# Patient Record
Sex: Female | Born: 1935 | Race: White | Hispanic: No | Marital: Married | Smoking: Never smoker
Health system: Southern US, Community
[De-identification: ages and names within clinical notes are randomized; demographics above are authoritative.]

## PROBLEM LIST (undated history)

## (undated) DIAGNOSIS — E039 Hypothyroidism, unspecified: Secondary | ICD-10-CM

## (undated) DIAGNOSIS — K5790 Diverticulosis of intestine, part unspecified, without perforation or abscess without bleeding: Secondary | ICD-10-CM

## (undated) DIAGNOSIS — E785 Hyperlipidemia, unspecified: Secondary | ICD-10-CM

## (undated) DIAGNOSIS — I219 Acute myocardial infarction, unspecified: Secondary | ICD-10-CM

## (undated) DIAGNOSIS — G459 Transient cerebral ischemic attack, unspecified: Secondary | ICD-10-CM

## (undated) DIAGNOSIS — I2510 Atherosclerotic heart disease of native coronary artery without angina pectoris: Secondary | ICD-10-CM

## (undated) DIAGNOSIS — D509 Iron deficiency anemia, unspecified: Secondary | ICD-10-CM

## (undated) DIAGNOSIS — I4891 Unspecified atrial fibrillation: Secondary | ICD-10-CM

## (undated) DIAGNOSIS — K648 Other hemorrhoids: Secondary | ICD-10-CM

## (undated) DIAGNOSIS — I10 Essential (primary) hypertension: Secondary | ICD-10-CM

## (undated) DIAGNOSIS — K2270 Barrett's esophagus without dysplasia: Secondary | ICD-10-CM

## (undated) DIAGNOSIS — K219 Gastro-esophageal reflux disease without esophagitis: Secondary | ICD-10-CM

## (undated) DIAGNOSIS — N189 Chronic kidney disease, unspecified: Secondary | ICD-10-CM

## (undated) DIAGNOSIS — K222 Esophageal obstruction: Secondary | ICD-10-CM

## (undated) HISTORY — DX: Gastro-esophageal reflux disease without esophagitis: K21.9

## (undated) HISTORY — DX: Hypothyroidism, unspecified: E03.9

## (undated) HISTORY — DX: Other hemorrhoids: K64.8

## (undated) HISTORY — DX: Hyperlipidemia, unspecified: E78.5

## (undated) HISTORY — DX: Iron deficiency anemia, unspecified: D50.9

## (undated) HISTORY — PX: THYROIDECTOMY, PARTIAL: SHX18

## (undated) HISTORY — DX: Acute myocardial infarction, unspecified: I21.9

## (undated) HISTORY — DX: Chronic kidney disease, unspecified: N18.9

## (undated) HISTORY — DX: Esophageal obstruction: K22.2

## (undated) HISTORY — DX: Transient cerebral ischemic attack, unspecified: G45.9

## (undated) HISTORY — DX: Unspecified atrial fibrillation: I48.91

## (undated) HISTORY — DX: Atherosclerotic heart disease of native coronary artery without angina pectoris: I25.10

## (undated) HISTORY — DX: Diverticulosis of intestine, part unspecified, without perforation or abscess without bleeding: K57.90

## (undated) HISTORY — DX: Essential (primary) hypertension: I10

## (undated) HISTORY — DX: Barrett's esophagus without dysplasia: K22.70

---

## 1988-07-02 HISTORY — PX: PARTIAL HYSTERECTOMY: SHX80

## 1994-07-02 HISTORY — PX: CHOLECYSTECTOMY: SHX55

## 1998-02-16 ENCOUNTER — Ambulatory Visit (HOSPITAL_COMMUNITY): Admission: RE | Admit: 1998-02-16 | Discharge: 1998-02-16 | Payer: Self-pay | Admitting: Obstetrics & Gynecology

## 1998-04-28 ENCOUNTER — Other Ambulatory Visit: Admission: RE | Admit: 1998-04-28 | Discharge: 1998-04-28 | Payer: Self-pay | Admitting: *Deleted

## 1999-12-15 ENCOUNTER — Emergency Department (HOSPITAL_COMMUNITY): Admission: EM | Admit: 1999-12-15 | Discharge: 1999-12-15 | Payer: Self-pay

## 2000-01-04 ENCOUNTER — Ambulatory Visit (HOSPITAL_COMMUNITY): Admission: RE | Admit: 2000-01-04 | Discharge: 2000-01-04 | Payer: Self-pay | Admitting: Cardiology

## 2000-01-04 ENCOUNTER — Encounter: Payer: Self-pay | Admitting: Cardiology

## 2000-03-07 ENCOUNTER — Ambulatory Visit (HOSPITAL_BASED_OUTPATIENT_CLINIC_OR_DEPARTMENT_OTHER): Admission: RE | Admit: 2000-03-07 | Discharge: 2000-03-07 | Payer: Self-pay | Admitting: General Surgery

## 2000-03-07 ENCOUNTER — Encounter (INDEPENDENT_AMBULATORY_CARE_PROVIDER_SITE_OTHER): Payer: Self-pay | Admitting: Specialist

## 2001-01-19 ENCOUNTER — Inpatient Hospital Stay (HOSPITAL_COMMUNITY): Admission: EM | Admit: 2001-01-19 | Discharge: 2001-01-22 | Payer: Self-pay | Admitting: Emergency Medicine

## 2001-01-19 ENCOUNTER — Encounter: Payer: Self-pay | Admitting: Emergency Medicine

## 2001-01-20 ENCOUNTER — Encounter: Payer: Self-pay | Admitting: Internal Medicine

## 2002-07-02 DIAGNOSIS — I2510 Atherosclerotic heart disease of native coronary artery without angina pectoris: Secondary | ICD-10-CM

## 2002-07-02 DIAGNOSIS — I219 Acute myocardial infarction, unspecified: Secondary | ICD-10-CM

## 2002-07-02 HISTORY — DX: Atherosclerotic heart disease of native coronary artery without angina pectoris: I25.10

## 2002-07-02 HISTORY — DX: Acute myocardial infarction, unspecified: I21.9

## 2002-10-28 ENCOUNTER — Encounter: Payer: Self-pay | Admitting: Internal Medicine

## 2002-10-28 ENCOUNTER — Ambulatory Visit (HOSPITAL_COMMUNITY): Admission: RE | Admit: 2002-10-28 | Discharge: 2002-10-28 | Payer: Self-pay | Admitting: Internal Medicine

## 2003-06-15 ENCOUNTER — Encounter: Admission: RE | Admit: 2003-06-15 | Discharge: 2003-06-15 | Payer: Self-pay | Admitting: Nephrology

## 2004-03-07 ENCOUNTER — Inpatient Hospital Stay (HOSPITAL_COMMUNITY): Admission: AD | Admit: 2004-03-07 | Discharge: 2004-03-10 | Payer: Self-pay | Admitting: Internal Medicine

## 2004-11-07 ENCOUNTER — Ambulatory Visit (HOSPITAL_COMMUNITY): Admission: RE | Admit: 2004-11-07 | Discharge: 2004-11-07 | Payer: Self-pay | Admitting: Internal Medicine

## 2006-05-07 ENCOUNTER — Ambulatory Visit: Payer: Self-pay | Admitting: Gastroenterology

## 2006-06-01 DIAGNOSIS — K2270 Barrett's esophagus without dysplasia: Secondary | ICD-10-CM

## 2006-06-01 HISTORY — DX: Barrett's esophagus without dysplasia: K22.70

## 2006-06-12 ENCOUNTER — Encounter (INDEPENDENT_AMBULATORY_CARE_PROVIDER_SITE_OTHER): Payer: Self-pay | Admitting: *Deleted

## 2006-06-12 ENCOUNTER — Ambulatory Visit: Payer: Self-pay | Admitting: Gastroenterology

## 2006-07-29 ENCOUNTER — Ambulatory Visit: Payer: Self-pay | Admitting: Gastroenterology

## 2006-08-13 ENCOUNTER — Ambulatory Visit (HOSPITAL_COMMUNITY): Admission: RE | Admit: 2006-08-13 | Discharge: 2006-08-13 | Payer: Self-pay | Admitting: Internal Medicine

## 2007-01-30 ENCOUNTER — Ambulatory Visit: Payer: Self-pay | Admitting: *Deleted

## 2007-09-22 ENCOUNTER — Ambulatory Visit (HOSPITAL_COMMUNITY): Admission: RE | Admit: 2007-09-22 | Discharge: 2007-09-22 | Payer: Self-pay | Admitting: Internal Medicine

## 2009-05-06 ENCOUNTER — Encounter (INDEPENDENT_AMBULATORY_CARE_PROVIDER_SITE_OTHER): Payer: Self-pay | Admitting: *Deleted

## 2009-12-15 ENCOUNTER — Telehealth: Payer: Self-pay | Admitting: Gastroenterology

## 2009-12-19 ENCOUNTER — Emergency Department: Payer: Self-pay | Admitting: Emergency Medicine

## 2010-02-10 ENCOUNTER — Encounter: Admission: RE | Admit: 2010-02-10 | Discharge: 2010-02-10 | Payer: Self-pay | Admitting: Internal Medicine

## 2010-06-07 ENCOUNTER — Encounter
Admission: RE | Admit: 2010-06-07 | Discharge: 2010-06-07 | Payer: Self-pay | Source: Home / Self Care | Attending: Internal Medicine | Admitting: Internal Medicine

## 2010-08-01 NOTE — Progress Notes (Signed)
Summary: Schedule Colonoscopy  Phone Note Outgoing Call Call back at Home Phone [phone]   Call placed by: Harlow Mares CMA Duncan Dull),  December 15, 2009 1:50 PM Call placed to: Patient Summary of Call: patient is due for EGD for follow up of her Barretts she states that she is sick right now and she does not wish to schedule I advised her that Dr. Russella Dar does not have any open appts until 01/2010 and that we could schedule one in Aug. She still declined stated that she has alot of other stuff going on in her life and she will call back if she changes her mind. I advised her that her Barretts could led to esophageal cancer if not monitered. she verbalized undertanding, and states that she will call back when she decides to have her colonoscopy Initial call taken by: Harlow Mares CMA Duncan Dull),  December 15, 2009 1:54 PM

## 2010-11-14 NOTE — Procedures (Signed)
CAROTID DUPLEX EXAM   INDICATION:  Left eye visual discomfort.   HISTORY:  Diabetes:  No  Cardiac:  Yes  Hypertension:  Yes  Smoking:  No  Previous Surgery:  No.  Please see note below.  CV History:  Amaurosis Fugax Yes No, Paresthesias Yes No, Hemiparesis Yes No                                        RIGHT             LEFT  Brachial systolic pressure:         180               180  Brachial Doppler waveforms:         Biphasic          Biphasic  Vertebral direction of flow:        Antegrade         Antegrade  DUPLEX VELOCITIES (cm/sec)  CCA peak systolic                   93                77  ECA peak systolic                   282               115  ICA peak systolic                   82                76  ICA end diastolic                   16                0.08  PLAQUE MORPHOLOGY:                  Calcified         Calcified  PLAQUE AMOUNT:                      Mild              Mild  PLAQUE LOCATION:                    ICA, ECA          ICA, ECA   IMPRESSION:  1-39% stenosis noted in bilateral ICA's.  Antegrade  bilateral vertebral arteries.   ___________________________________________  Quita Skye Hart [place], M.D.   MG/MEDQ  D:  01/30/2007  T:  01/31/2007  Job:  [id number]

## 2010-11-17 NOTE — H&P (Signed)
NAME:  Karen Stuart, Karen Stuart                      ACCOUNT NO.:  [id number]   MEDICAL RECORD NO.:  [id number]                   PATIENT TYPE:  INP   LOCATION:  2028                                 FACILITY:  [facility]   PHYSICIAN:  Mark A. Perini, M.D.                DATE OF BIRTH:  March 20, 1926   DATE OF ADMISSION:  03/07/2004  DATE OF DISCHARGE:                                HISTORY & PHYSICAL   CHIEF COMPLAINT:  Cough, fever, anorexia, weakness.   HISTORY OF PRESENT ILLNESS:  [name] is a pleasant 77 year old female  with a past medical history significant for hypertension, atherosclerotic  coronary disease, atherosclerotic peripheral vascular disease, chronic renal  insufficiency, hypothyroidism, and hyperlipidemia. She was seen in the  office on February 23, 2004, for a regular visit at which time she was doing  quite well. There were no significant changes made to her regimen at that  time. However, her husband developed a bronchitis respiratory type illness  which he is currently recovering from. However, approximately four or five  days prior to admission the patient developed hoarseness and cough, and over  the last three to four days has had fevers up to 101 as well as significant  nonproductive cough. She has had very poor appetite with very little oral  intake and she has felt quite fatigued. She presented to the office today at  which time she had a peak flow of 200 liters per minute, pulse oximetry 93%  on room air, blood pressure 128/68, pulse mildly tachycardic at 110,  respiratory rate 24, temperature 98.2. Her weight was down 5 pounds since  her previous visit just two weeks ago. Upon exam she had decreased breath  sounds throughout the right side of her lung field and chest x-ray was  suspicious for a right-sided lower lobe infiltrate. It was contemplated  treating her as an outpatient; however the patient did appear quite weak and  therefore arrangements were made to  have her directly admitted to the  hospital. However when she stood up to try to walk to the car to come to  admitting, she became weak, and sat down in a chair in our hallway. At that  point she then had a syncopal episode where she had complete loss of  consciousness. There was no injury sustained and she was brought to the  floor and lay down supine. For the 10 to 15 seconds she was completely  unresponsive, with loss of color and no pulse felt for a few seconds.  However, she then spontaneously regained pulse, opened her eyes, and began  breathing normally. At that time blood pressure was 170/70, pulse 110,  saturation 97% on room air. She was completely appropriate and oriented at  that time. There was no definite seizure activity. No stroke activity noted.  The patient had no chest pain or shortness of breath. It was felt that this  event was  most likely due to orthostasis from her poor oral intake and her  ongoing pneumonia. An ambulance was summoned emergently and the patient was  transported to the emergency room where further care was given. She will  require admission for further treatment.   PAST MEDICAL HISTORY:  1.  Hypertension.  2.  History of migraines.  3.  Hypothyroidism.  4.  Post menopausal status.  5.  Hypolipidemia.  6.  Remote history of  peptic ulcer disease.  7.  Severe bruising, on aspirin and Plavix in 2002, but she has since that      time done well with Plavix monotherapy.  8.  TIA in July 2002.  9.  G2, P2 parity status with normal vaginal deliveries.  10. Atherosclerotic coronary disease, status post MI while at [facility]      in July 2004. At that time no interventions were done, but she did have      a four day admission and was treated medically. Ejection fraction was      45%.  11. She has chronic renal insufficiency since at least 2003 and her baseline      BUN and creatinine from our office are 29 and 2.1 from just two weeks      ago.  12.  History of cholecystectomy and appendectomy as well as thyroidectomy and      partial hysterectomy, bladder tack, and bilateral salpingo-oophorectomy.  13. In September 2001, she was diagnosed with probable temporal arteritis      and did well with a course of prednisone with apparent remission of      this.  14. Osteoporosis since May 2003.   ALLERGIES:  AMOXICILLIN; CODEINE; severe bruising with ASPIRIN AND PLAVIX  combination therapy; LESCOL XL and ZOCOR cause severe myalgias and weakness;  WELCHOL, the patient had difficulty swallowing pills; COLESTYRAMINE caused  gas and she could not take it along with EVISTA therapy.   CURRENT MEDICATIONS:  1.  Norvasc 10 mg daily.  2.  Plavix 75 mg daily.  3.  Evista 60 mg daily.  4.  Triamterene/hydrochlorothiazide 37.5/25 mg one tablet daily.  5.  Lopressor 50 mg b.i.d.  6.  Nitroglycerin tablet 0.4 mg as needed.  7.  Vytorin 10/80 one tablet each evening.  8.  Benazepril 40 mg daily.  9.  Calcium with vitamin D daily.   SOCIAL HISTORY:  She is married. Her husband, Sharon Seller, is with her. This is  her second marriage. She has been married for five years. She has one living  daughter. No grandchildren. She has a high school education. 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 No tobacco history. No alcohol history.  No drug use history.   FAMILY HISTORY:  Father died at age 61 of an MI. Mother died at age 21 of an  MI. She has one brother who has had five bypasses. One son died at age 59 of  an MI and one daughter is still living and in good health.   REVIEW OF SYSTEMS:  As per the history of present illness. The patient  denies any blood from above or below. She has not producing sputum, but  feels as though there is a lot of congestion in her chest that has not come  up yet. She has had chills on and off. She denies any rhinitis or otitis  type symptoms. She has no history of asthma. She did have loose stools two times. She  denies any  myalgias or arthralgias. No new chest pains or edema.  No nausea, vomiting, or constipation noted.   PHYSICAL EXAMINATION:  VITAL SIGNS: Current temperature is 98.4, pulse 109  and regular, respiratory rate 20, blood pressure 146/53, 97% saturation on  room air.  GENERAL: She is in no acute distress. She is hoarse.  LUNGS: She has decreased breath sounds bilaterally with some slight wheezes  on expiration noted.  HEART: Regular rate and rhythm, mildly tachycardic with a 2/6 murmur at the  right and left sternal border and systole.  ABDOMEN: Soft and nontender. There is no peripheral edema.  NEUROLOGIC: Intact; alert and oriented times three with movement of  extremities time four.   LABORATORY DATA:  BNP is 105, troponin-I 0.04, CK 96, MB 1.6.  LFTs are  normal except for an albumin of 3.0. Sodium is 131, potassium 3.6, chloride  99, CO2 20, BUN 26, creatinine 2.2, glucose 127. White count 12.9 with 92%  segs, 4% lymphocytes, hemoglobin 10.3, platelet count 266,000. Blood  cultures times two are pending. A urine with legionella antigen has been  sent from our office. She did have one dose of an oral antibiotic, Levaquin  750 mg, once in the office prior to the blood cultures being sent. EKG in  the hospital showed sinus tachycardia with left ventricular hypertrophy and  left axis deviation, but otherwise is essentially normal.   Chest x-ray in the office suspicious of right lower lobe infiltrate. Chest x-  ray in the hospital is pending.   ASSESSMENT AND PLAN:  A 77 year old female with underlying coronary disease,  peripheral vascular disease, chronic renal insufficiency, and  hypothyroidism, who presents with a right-sided community-acquired pneumonia  with some bronchospasm as well as significant orthostasis and an episode of  syncope in the office today. We will admit to a telemetry unit. We will  hydrate with normal saline and treat with intravenous Avelox. We will  treat  with Xopenex nebulizer as well as Advair b.i.d. We will DVT prophylax his  Lovenox and place the patient on Protonix for ulcer prophylaxis. Will  continue her home medications to the best of our ability. However, we will  place her Evista on hold as she will be immobilized. Hopefully with the  treatment of her underlying pneumonia her strength will improve and she will  no longer be orthostatic.                                                Mark A. Waynard Edwards, M.D.    MAP/MEDQ  D:  03/07/2004  T:  03/07/2004  Job:  [id number]   cc:   Darden Palmer., M.D.  [address], [address]  [place]  [place] [address]  Fax: [phone]   Cecille Aver, M.D.  [address]  [place]  [place] [address]  Fax: [phone]

## 2010-11-17 NOTE — Discharge Summary (Signed)
NAME:  Karen Stuart, Karen Stuart                      ACCOUNT NO.:  [id number]   MEDICAL RECORD NO.:  [id number]                   PATIENT TYPE:  INP   LOCATION:  2028                                 FACILITY:  [facility]   PHYSICIAN:  Mark A. Perini, M.D.                DATE OF BIRTH:  [birth year]   DATE OF ADMISSION:  03/07/2004  DATE OF DISCHARGE:  03/10/2004                                 DISCHARGE SUMMARY   DISCHARGE DIAGNOSES:  1.  Right-sided community acquired pneumonia, particularly involving the      right upper lobe and to some degree the right lower lobe, improving at      the time of discharge.  2.  Syncope due to dehydration and orthostasis prior to admission with no      recurrence.  3.  Hypokalemia replaced during this admission.  4.  Hypertension.  5.  Hypothyroidism, stable.  6.  Chronic renal insufficiency, stable.  7.  Hyperlipidemia.  8.  Atherosclerotic coronary disease, stable at this time.  9.  History of migraines.  10. History of transient ischemic attack in 2002.  11. Remote history of temporal arteritis with no evidence of this currently.  12. Osteoporosis.   PROCEDURE:  None.   DISCHARGE MEDICATIONS:  1.  Mucinex over-the-counter 600 mg two tablets b.i.d. as an expectorant.  2.  Norvasc 10 mg daily.  3.  Plavix 75 mg daily.  4.  Lotensin 40 mg daily.  5.  Triamterene/hydrochlorothiazide 37.5/25 one daily.  6.  Metoprolol 50 mg b.i.d.  7.  Evista 60 mg daily once patient is more mobile.  8.  Advair 100/50 one puff b.i.d. until further notice.  9.  Vytorin 10/80 one tablet daily.  10. Calcium with D as before.  11. Nitroglycerin as needed as before.  12. Tessalon Perles 100 mg q.8h. as needed for cough.  13. Avelox 400 mg once daily for six further days.  14. Tylenol as needed.  15. Xopenex and Atrovent nebulizers t.i.d. until further notice.   HISTORY OF PRESENT ILLNESS:  [name] is a 77 year old female with past  history of hypertension, coronary  disease, and chronic renal insufficiency.  She presented to the office on the day of admission with a four to five day  history of fevers and cough.  She also had poor oral intake and was quite  fatigued.  In the office vital signs were stable with the exception of a  mild tachycardia of 110 beats per minute.  She was orthostatic in the  office.  It was elected to admit her for further care.  Prior to her coming  in she had a syncopal event in the hallway of our office.  She quickly  responded to Trendelenburg position and IV fluid resuscitation.  EKG showed  LVH, but no acute changes.  She was admitted for further care.   HOSPITAL COURSE:  [name] was admitted.  Follow-up  x-ray confirmed the  right upper lobe pneumonia.  She was treated with oxygen, nebulizer  treatments, and intravenous antibiotics as well as gentle IV hydration.  She  gradually improved.  She defervesced.  She had no further syncopal episodes  and no problems were noted on her telemetry.  By March 10, 2004 she was  deemed stable for discharge home for continued outpatient care.   PHYSICAL EXAMINATION:  VITAL SIGNS:  Afebrile.  Temperature 98, pulse 100,  respiratory rate 18, blood pressure 153/64, 98% saturation on room air.  She  had had 1600 mL in, 500 mL out.  GENERAL:  She is in no acute distress.  LUNGS:  Relatively clear to auscultation bilaterally with no significant  wheezes, rales, or rhonchi.  HEART:  Regular rate and rhythm with a 1-2/6 murmur at the left sternal  border that was unchanged.  ABDOMEN:  Soft and nontender.  EXTREMITIES:  There was no peripheral edema.  NEUROLOGIC:  She was alert and oriented x4.   DISCHARGE LABORATORIES:  White count 10.1 with 78% segs, 11% lymphocytes, 8%  monocytes, hemoglobin 10.1, platelet count 329,000.  Sodium 135, potassium  4.1, chloride 105, CO2 22, BUN 17, creatinine 1.9, glucose 106, calcium 8.9.  TSH was 0.38.  Blood cultures x2 were negative at the  time of discharge.  Urine culture was negative at the time of discharge but it should be noted  that these were both taken with one dose of antibiotic on-board.   DISCHARGE INSTRUCTIONS:  [name] is to be up as tolerated.  She is to  follow her previous diet which is low fat, low cholesterol, low salt diet,  although she may liberalize her diet for the next week or two as she tries  to improve her strength.  She is to call if she has any recurrent problems.  She will follow up in our office in approximately one week.                                                Mark A. Waynard Edwards, M.D.    MAP/MEDQ  D:  03/10/2004  T:  03/11/2004  Job:  [id number]   cc:   Darden Palmer., M.D.  [address]  [place]  [place] [address]  Fax: [phone]   Cecille Aver, M.D.  [address]  [place]  [place] [address]  Fax: [phone]

## 2010-11-17 NOTE — H&P (Signed)
[facility]  Patient:    Karen Stuart, Karen Stuart                     MRN: [id number] Adm. Date:  [id number] Attending:  Beatris Ship                         History and Physical  CHIEF COMPLAINT:  Garbled speech.  HISTORY OF PRESENT ILLNESS:  [name] is a pleasant 77 year old white female with a lengthy history of essential hypertension, presenting at this time with garbled speech.  She does have a history of intermittent/chronic headaches and a long-standing migraine history.  Evidently, she has been worked up recently for temporal arteritis with temporal artery biopsy negative.  She is currently on a prednisone taper and by her report, is being referred to a pain clinic, but the details are not clear.  She has had a CT/MRI of the brain one year prior which was negative.  She has been in very good health but has had a typical migraine-type headache for approximately three days.  This has not interrupted her sleep and it has been unaccompanied by nausea or vomiting.  She relates going to church this morning and subsequently driving to [facility] to assist daughter having been locked out of a car.  She returned home and took a nap.  Upon arising in response to the telephone, she related that she could not speak.  Evidently, she understood everything that was going on and clearly understood that she could not express herself.  She went to let her husband know that daughter was on the phone and daughter quickly alerted husband that mother could not speak. Husband clearly relates that speech was nonsensible and was in broken sentences with some mispronunciation but some of the words were certainly intelligible.  After about 30 minutes, she totally cleared up and during which she had absolutely no chest pain or shortness of breath, no motor or sensory symptoms and no altered mental status.  She presented to the emergency room without symptomatology.   She has had no dizziness or light-headedness and denies any head trauma.  ALLERGIES:  No known drug allergies.  MEDICATIONS: 1. Toprol-XL 100 mg p.o. q.d. 2. Accupril 40 mg p.o. q.d. 3. Prednisone 5 mg one-half daily.  MEDICAL ILLNESSES:  Essential hypertension and migraine headaches.  SURGICAL ILLNESSES:  Cholecystectomy, appendectomy, total abdominal hysterectomy, temporal artery biopsy.  FAMILY HISTORY:  Noncontributory.  SOCIAL HISTORY:  Patient is married and has one child.  Does have one son deceased with premature cardiac disease.  She does not smoke or drink.  PHYSICAL EXAMINATION:  VITAL SIGNS:  Temperature 97.9, blood pressure 220/120, pulse 80, respiratory rate 20.  GENERAL:  Patient is pleasant, a bit anxious, in no distress, lying supine in bed.  SKIN:  Warm and non-diaphoretic.  HEENT:  Anicteric.  Pupils are round and reactive of approximately 3 to 4 mm. Extraocular movements intact.  Visual fields intact.  Fundi not visualized.  NECK:  Supple.  No JVD or bruits.  Carotids 2+/4+.  No bruits.  LUNGS:  Clear.  CARDIOVASCULAR:  Regular rate and rhythm.  No murmur, gallop, rub or heave.  ABDOMEN:  Soft, nontender.  No hepatosplenomegaly.  Bowel sounds normal.  EXTREMITIES:  No cyanosis, clubbing or edema.  Distal pulses intact.  Joints normal.  Calves soft and nontender.  NEUROLOGIC:  Higher cortical functioning is intact.  Cranial nerves II-XII intact.  Motor  5/5.  Sensation intact to all modalities.  Gait not assessed. Cerebellar exam is normal.  IMAGING STUDY:  CT scan of the brain unremarkable.  LABORATORY DATA:  Additional lab work is pending at this time.  ASSESSMENT: 1. Probable left brain transient ischemic attack with expressive aphasia,    currently resolved. 2. Accelerated hypertension superimposed upon essential hypertension, stable.  PLAN:  Patient is admitted for hypertensive control, neurologic workup and initiation of empiric  aspirin and Plavix.  Carotid Dopplers will be done and MRI done at some point as well, further pending her course. DD:  01/19/01 TD:  01/20/01 Job: [id number]

## 2010-11-17 NOTE — Assessment & Plan Note (Signed)
[facility]                         GASTROENTEROLOGY OFFICE NOTE   Karen, Stuart                   MRN:          [id number]  DATE:07/29/2006                            DOB:          [birth year]    [name] returns following colonoscopy and upper endoscopy. A short  segment of Barrett's esophagus was noted without evidence of dysplasia.  [name] has ongoing complaints of flatulence and intestinal gas  associated with alternating diarrhea and constipation. She feels her  flatulence and intestinal gas has worsened since the procedures. This  also correlates with the time of changing from Protonix to Omeprazole  for cost reasons.   CURRENT MEDICATIONS:  Listed on the chart, updated and reviewed.   MEDICATION ALLERGIES:  CODEINE.   PHYSICAL EXAMINATION:  In no acute distress. Weight 140.8 pounds, blood  pressure 170/70, pulse 60 and regular.  CHEST: Clear to auscultation bilaterally.  CARDIAC: Regular rate and rhythm without murmurs appreciated.  ABDOMEN: Soft and nontender. Nondistended. Normoactive bowel sounds. No  palpable organomegaly, masses or hernias.   ASSESSMENT/PLAN:  1. Gastroesophageal reflux disease complicated by a peptic stricture      and Barrett's esophagus. Her intestinal gas has increased since      starting Omeprazole, therefore, will discontinue Omeprazole and      resume Protonix 40 mg p.o. q a.m. She is to maintain all standard      anti-reflux measures. Recall endoscopy planned for December 2010.  2. Intestinal gas/flatus, alternating diarrhea and constipation. She      is given low gas diet and advised to use Benefiber supplement on a      daily basis and increase her fluid intake. Return office visit in 4-      6 weeks. If her symptoms do not substantially improve, will      consider empiric trial of Xifaxan.     Karen Stuart. Karen Dar, MD, [facility]  Electronically Signed    MTS/MedQ  DD: 07/29/2006  DT:  07/29/2006  Job #: [id number]   cc:   Karen Stuart, M.D.

## 2010-11-17 NOTE — Op Note (Signed)
[place]  Patient:    Karen Stuart, Karen Stuart                     MRN: [id number] Proc. Date: 03/07/00 Adm. Date:  [id number] Attending:  Brandy Hale CC:         Rodrigo Ran, M.D.   Operative Report  PREOPERATIVE DIAGNOSIS:  Rule out giant cell arteritis.  POSTOPERATIVE DIAGNOSIS:  Rule out giant cell arteritis.  OPERATION PERFORMED:  Right temporal artery biopsy.  SURGEON:  Angelia Mould. Derrell Lolling, M.D.  OPERATIVE INDICATIONS:  This is a 77 year old woman who has right-sided headaches with intermittent visual impairment associated with her headaches. She has hypertension.  Workup has revealed elevated sedimentation rate.  She has been placed steroids and her symptoms have improved.  She was referred for a temporal artery biopsy to rule out giant cell arteritis.  DESCRIPTION OF PROCEDURE:  The patient was brought to the minor surgery room at [facility] where the right pretemporal area was prepped and draped in a sterile fashion.  One percent Xylocaine with epinephrine was used as a local infiltration anesthetic.  An incision was made in front of the right ear and extended anteriorly along the course of the main temporal artery and the anterior branch.  Dissection was carried down through the subcutaneous tissue.  We dissected into the fascia and identified the temporal artery.  We isolated about a 2.5 cm segment of the right temporal artery.  The artery was clamped proximally and distally and excised.  This was sent for histologic exam with appropriate clinical history attached.  The ends of the artery were ligated with 3-0 Vicryl ties.  The wound was irrigated with saline. Hemostasis was excellent.  The skin was closed with a running subcuticular suture of 4-0 Vicryl and Steri-Strips.  Clean bandages were placed and the patient taken to the recovery room in stable condition.  Estimated blood loss was about 5 cc.  Complications  none. Sponge, needle, and instrument counts were correct. DD:  03/07/00 TD:  03/08/00 Job: [id number]

## 2010-11-17 NOTE — Assessment & Plan Note (Signed)
[facility]                           GASTROENTEROLOGY OFFICE NOTE   Karen, Stuart                   MRN:          [id number]  DATE:05/07/2006                            DOB:          [birth year]    REFERRING PHYSICIAN:  Loraine Leriche A. Perini, M.D.   REASON FOR REFERRAL:  Recent epigastric pain and chronic diarrhea.   HISTORY OF PRESENT ILLNESS:  [name] is a 77 year old white female  referred through the courtesy of Dr. Waynard Edwards.  She notes problems with  diarrhea occurring approximately two to three times a week for many years.  She states her symptoms began just after her cholecystectomy in 1966.  Her  diarrhea has worsened over the past several weeks and she has had episodes  of fecal incontinence.  She notes no melena, hematochezia, change in stool  caliber, nausea, vomiting or weight loss.  She also relates the recent onset  of epigastric pain which has essentially resolved since beginning Protonix.  No dysphagia or odynophagia. A urinary tract infection was recently  diagnosed and she was treated with a short course of Cipro.  Blood work from  April 15, 2006 revealed a mildly elevated BUN and creatinine which is a  chronic stable problem.  The remainder of her chemistry panel and CBC were  unremarkable.  Amylase and lipase were normal.  A CT scan of the abdomen and  pelvis with oral contrast only was performed on [facility] on April 16, 2006 which showed prior cholecystectomy, common bowel duct measuring at 1 cm  at the porta hepatis, a small right adrenal nodule, multiple parapelvic  renal cysts, atherosclerosis, sigmoid colon diverticulosis and some urinary  bladder air.   PAST MEDICAL HISTORY:  1. Hypertension.  2. Coronary artery disease, status post myocardial infarction in 2004.  3. Hyperlipidemia.  4. History of TIAs.  5. History of an unspecified thyroid disorder.  6. Chronic renal insufficiency.  7. Status post  cholecystectomy in 1996.  8. Status post partial hysterectomy with a bladder tacking 1990.  9. Sigmoid colon diverticulosis.   CURRENT MEDICATIONS:  Listed on the chart, updated and reviewed.   MEDICATION ALLERGIES:  CODEINE.   SOCIAL HISTORY/REVIEW OF SYSTEMS:  Per the handwritten form.   PHYSICAL EXAMINATION:  A well-developed, well-nourished white female in no  acute distress.  Height 5 feet 2 inches.  Weight 137 pounds.  Blood pressure  is 178/68, pulse 60 and regular.  HEENT:  Anicteric sclerae.  Oropharynx clear.  CHEST:  Clear to auscultation bilaterally.  CARDIAC:  Regular rate and rhythm without murmurs appreciated.  ABDOMEN:  Soft, nontender, and nondistended.  Normal active bowel sounds.  No palpable organomegaly, masses or hernias.  RECTAL:  No lesions.  Hemoccult negative stool and seemingly normal  sphincter tone.  EXTREMITIES:  Without clubbing, cyanosis, or edema.  NEUROLOGICAL:  Alert and oriented x3.  Grossly nonfocal.   ASSESSMENT/PLAN:  1. New onset epigastric pain:  Symptoms resolved on Protonix.  Rule out      gastritis, gastroesophageal reflux disease and ulcer disease.  Possible      biliary etiology given  mild biliary dilation. The biliary dilation is      probably related to her prior cholecystectomy and therefore may not      indicate any biliary pathology. Monitor LFTs in 2-3 months. Risks,      benefits, and alternatives to upper endoscopy discussed with the      patient and she consents to proceed.  This will be scheduled      electively.  She will remain on Protonix 40 mg p.o. q.a.m. If her      symptoms recur and the EGD is not diagnostic would repeat LFTs and      consider MRCP or ERCP.  2. Chronic diarrhea with a recent exacerbation and episodic fecal      incontinence:  Obtain standard stool studies for culture and      sensitivity, Clostridium difficile assay and ova and parasite studies.      After these studies are obtained, she will begin an  empiric trial of      metronidazole unless otherwise indicated from the stool studies.  May      use Imodium b.i.d. p.r.n.  She is advised to increase her fiber intake.      Rule out colorectal neoplasm and inflammatory bowel disease.  Risks,      benefits, and alternatives to colonoscopy and possible biopsy and      possible polypectomy discussed with the patient.  She consents to      proceed.  This will be scheduled electively at the time of her upper      endoscopy.     Venita Lick. Russella Dar, MD, [facility]  Electronically Signed    MTS/MedQ  DD: 05/12/2006  DT: 05/12/2006  Job #: [id number]   cc:   Loraine Leriche A. Perini, M.D.

## 2010-11-17 NOTE — Discharge Summary (Signed)
[facility]  Patient:    Karen Stuart, Karen Stuart                     MRN: [id number] Adm. Date:  [id number] Disc. Date: 01/22/01 Attending:  Beatris Ship CC:         Genene Churn. Love, M.D.  Aundra Dubin, M.D.   Discharge Summary  DISCHARGE DIAGNOSES: 1. Left brain transient ischemic attack with aphasia, symptoms resolved. 2. Small vessel disease bilaterally by MRI. 3. Essential tremor. 4. Severe hypertension. 5. Headache disorder thought possibly to be a temporal arteritis, although    other headache considerations such as migraines versus uncontrolled    hypertension remain in the differential for this patient.  PROCEDURES: 1. CT scan of the head. 2. MRI/MRA of the brain. 3. Carotid Dopplers.  DISCHARGE MEDICATIONS: 1. Plavix 75 mg p.o. q.d. 2. Enteric-coated aspirin 325 mg q.d. 3. Prednisone 2.5 mg q.d. 4. Toprol XL 100 mg q.d. 5. Accupril 40 mg q.d. 6. Norvasc 10 mg q.d. 7. Protonix 40 mg q.d. with a meal. 8. Maxzide 37.5/25 1/2 tablet q.d.  HISTORY OF PRESENT ILLNESS:  [name] is a 77 year old female with a history of hypertension and possible history of temporal arteritis versus other headache disorder.  The patient had been in her usual state of health, although she had still been having some migraine headaches especially for the last three days.  She is due to be evaluated in the headache clinic in the near future.  Upon arising from a nap on the day of admission, she had clear understanding of all that was being spoken to her, however, she could not express herself in any way, she could not speak at all.  Speech attempts were nonsensible and broken with mispronunciation and occasional intelligible words.  After approximately 30 minutes, her speech cleared.  She denied any other constitutional symptoms.  She presented to the emergency room asymptomatic and was admitted for further evaluation and treatment.  HOSPITAL  COURSE:  [name] was noted on admission to have severe hypertension with blood pressures 220/120 and higher.  She was placed on aspirin and Plavix, and antihypertensive medication was started, including a labetalol drip, although she required very little of this.  She had no recurrence of her aphasia symptoms during the admission.  Neurology was consulted and thought that she likely had a transient ischemic attack.  CT scan of the head initially did not show any acute infarct.  Carotid Dopplers performed showed no internal carotid artery stenosis bilaterally, and antegrade vertebral artery flow.  Subsequently, the patient had an MRI/MRA which showed significant small vessel disease bilaterally.  Furthermore, on the MRA images, she had evidence of a stenotic MCA vessel on the left, possible stenosis of a PCA vessel on the left.  Ms. Thompsons blood pressure did respond, although still remained somewhat labile at the time of discharge, ranging from the 140s systolic still up to the 240s at times.  She maintained full neurologic status, and had no further symptoms at the time of discharge. A 2-D echocardiogram was performed on 7/23, and the final report is being awaited and we will be informed of this before discharge.  The patient will be discharged home on 01/22/01, in stable condition.  NOTABLE LABORATORY DATA:  TSH on 7/21 was 0.95.  T4 was 5.7.  Sedimentation rate was 38.  Urinalysis was normal.  Comprehensive metabolic panel was normal with the exception of a glucose of 133.  INR  was 0.9 with a PTT of 33 seconds. CBC showed a white count of 8.6, hemoglobin 13.4, and platelet count of 252,000 with a normal differential.  DISCHARGE INSTRUCTIONS: 1. [name] is to take her discharge medications as instructed. 2. She is to follow a low salt diet, less than 2 g daily. 3. She is to call if she has any further problems or symptoms.  With any    significant symptoms, she is advised to  return immediately to the emergency    room. 4. She is to monitor her blood pressure daily with a goal blood pressure    ultimately less than 130/80, but she is to notify us if she is remaining    higher than the 190s/100s for any extended length of time. 5. She is to return for followup with Dr. ______ in two weeks, and she is to    be fasting at that time for a fasting lipid profile. DD:  01/22/01 TD:  01/22/01 Job: [id number]

## 2010-11-17 NOTE — Consult Note (Signed)
[facility]  Patient:    Karen Stuart, Karen Stuart                     MRN: [id number] Proc. Date: 01/19/01 Adm. Date:  [id number] Attending:  Beatris Ship                          Consultation Report  REASON FOR CONSULTATION:  Karen Stuart is a 77 year old right-handed white married female seen at the request of Dr. Gerlene Burdock A. Jacky Kindle and Dr. Earlyne Iba for evaluation of language disturbance.  HISTORY OF PRESENT ILLNESS:  Karen Stuart has a known prior history of migraine headaches beginning approximately 50 years ago occurring at a frequency of 2 or 3 times per year.  These headaches would occur in the right or left temporal region.  She would have visual disturbance with her headaches.  More commonly, they would be on the right than the left.  She states that in June of 2002, she began having headaches that would occur on a daily basis.  These were evaluated with sed rate, result unknown, and with right temporal biopsy which was unremarkable.  She was on a course of steroids and has been seen by a rheumatologist and slowly tapered to half of a 5 mg tablet per day.  Over the last two days, she has had recurrent headache and today, was talking on the telephone with her daughter.  She had difficulty getting her words out and could not speak; this occurred approximately 5:30 p.m.  She improved but came to the emergency room.  She has not had any history of jaw claudication, visual loss or muscle aches and pains.  PAST MEDICAL HISTORY:  Her past medical history is significant for hypertension for 10 years.  She has not had any history of myocardial infarction, diabetes mellitus, stroke or cigarette use.  MEDICATIONS: 1. Toprol-XL 100 mg one q.d. 2. Accupril 40 mg one q.d. 3. Prednisone 4 mg a half q.d.  No aspirin.  PHYSICAL EXAMINATION:  GENERAL:  Physical examination revealed a well-developed, pleasant white female.  VITAL  SIGNS:  Blood pressure high at 220/80 in the right arm and 220/90 in the left.  NEUROLOGIC:  Disks were flat.  The extraocular movements were full and corneals were present.  The facial sensation was equal.  There was no facial motor asymmetry.  Hearing was intact with air conduction greater than bone conduction.  Tongue was midline, uvula was midline and gags were present. Sternocleidomastoid and trapezius testing were normal.  Motor examination revealed good strength in the upper and lower extremities without any evidence of drift and coordination testing revealed outstretched hand and arm tremor. Sensory examination intact to pinprick and deep tendon reflexes 2+ with downgoing plantar responses.  She would follow one-, two- and three-step commands but used occasional neologisms, had some difficulty getting her words out and had definite difficulty in repetition.  IMPRESSION: 1. Aphasia, code 784.3. 2. Headaches, code 784.40. 3. Hypertension, code 796.2. 4. Tremor, code 333.1.  PLAN:  Patient is admitted for further studies to include MRI, MRA and Doppler study of the carotids.  Studies to date have included a urinalysis, a 12-lead EKG and CT scan of the brain without contrast, which to my eye appeared to be normal. DD:  01/19/01 TD:  01/20/01 Job: [id number]

## 2011-05-14 ENCOUNTER — Other Ambulatory Visit: Payer: Self-pay | Admitting: Internal Medicine

## 2011-05-14 DIAGNOSIS — R911 Solitary pulmonary nodule: Secondary | ICD-10-CM

## 2011-05-21 ENCOUNTER — Encounter: Payer: Self-pay | Admitting: Gastroenterology

## 2011-05-23 ENCOUNTER — Ambulatory Visit
Admission: RE | Admit: 2011-05-23 | Discharge: 2011-05-23 | Disposition: A | Discharge status: Home / Self Care | Payer: Medicare Other | Source: Ambulatory Visit | Attending: Internal Medicine | Admitting: Internal Medicine

## 2011-05-23 DIAGNOSIS — R911 Solitary pulmonary nodule: Secondary | ICD-10-CM

## 2011-06-12 ENCOUNTER — Encounter: Payer: Self-pay | Admitting: Gastroenterology

## 2011-06-12 ENCOUNTER — Other Ambulatory Visit: Payer: Medicare Other

## 2011-06-12 ENCOUNTER — Telehealth: Payer: Self-pay | Admitting: Gastroenterology

## 2011-06-12 ENCOUNTER — Ambulatory Visit (INDEPENDENT_AMBULATORY_CARE_PROVIDER_SITE_OTHER): Payer: Medicare Other | Admitting: Gastroenterology

## 2011-06-12 VITALS — BP 142/68 | HR 60 | Ht 63.0 in | Wt 124.0 lb

## 2011-06-12 DIAGNOSIS — K2270 Barrett's esophagus without dysplasia: Secondary | ICD-10-CM

## 2011-06-12 DIAGNOSIS — Z7901 Long term (current) use of anticoagulants: Secondary | ICD-10-CM

## 2011-06-12 DIAGNOSIS — D509 Iron deficiency anemia, unspecified: Secondary | ICD-10-CM

## 2011-06-12 NOTE — Patient Instructions (Signed)
Go directly to the basement today to have your labs drawn. Please call back to schedule your Endoscopy/ Colonoscopy with regular sedation off your coumadin.  You will be contaced by our office prior to your procedure for directions on holding your Coumadin/Warfarin.  If you do not hear from our office 1 week prior to your scheduled procedure, please call [phone] to discuss. Please restart your Iron one tablet by mouth twice daily.  cc: Rodrigo Ran, MD

## 2011-06-12 NOTE — Progress Notes (Signed)
History of Present Illness: This is an 77 year old female with iron deficiency anemia and Hemoccult negative. She underwent colonoscopy and upper endoscopy in December 2007 with findings of Barrett's esophagus, esophageal stricture, gastritis and colonic diverticulosis. She was recommended to undergo surveillance endoscopy for Barrett's in 2010 but did not return. She's not been taking any medications for acid reflux and denies any reflux symptoms. She was recently found to have an iron deficiency anemia with a ferritin of 12.1, iron saturation of 4% and hemoglobin of 9.4. She was recommended to start on iron but has not been taking iron. Her husband recently passed away. Denies weight loss, abdominal pain, constipation, diarrhea, change in stool caliber, melena, hematochezia, nausea, vomiting, dysphagia, reflux symptoms, chest pain.  No Known Allergies Outpatient Prescriptions Prior to Visit  Medication Sig Dispense Refill  . amLODipine (NORVASC) 10 MG tablet Take 10 mg by mouth daily.        . benazepril (LOTENSIN) 40 MG tablet Take 40 mg by mouth daily.        . Calcium Carbonate-Vitamin D (CALTRATE 600+D PO) Take 1 tablet by mouth 2 (two) times daily.        Marland Kitchen doxazosin (CARDURA) 2 MG tablet Take 2 mg by mouth at bedtime.        . ergocalciferol (VITAMIN D2) 50000 UNITS capsule Take 50,000 Units by mouth once a week.        . escitalopram (LEXAPRO) 10 MG tablet Take 5 mg by mouth daily.        . furosemide (LASIX) 40 MG tablet Take 40 mg by mouth daily.        . rosuvastatin (CRESTOR) 10 MG tablet Take 5 mg by mouth daily.        Marland Kitchen warfarin (COUMADIN) 2.5 MG tablet Take 2.5 mg by mouth daily. 1 1/2 tablets daily       . raloxifene (EVISTA) 60 MG tablet Take 60 mg by mouth daily.         Past Medical History  Diagnosis Date  . Iron deficiency anemia   . GERD (gastroesophageal reflux disease)   . Peptic stricture of esophagus   . Hypertension   . Coronary artery disease   . Myocardial  infarction 2004  . Hyperlipidemia   . TIA (transient ischemic attack)   . Hypothyroidism   . Chronic renal insufficiency   . Diverticulosis   . Barrett's esophagus 06/2006  . Internal hemorrhoids   . Atrial fibrillation   . Osteoporosis    Past Surgical History  Procedure Date  . Cholecystectomy 1996  . Partial hysterectomy 1990    w/ bladder tacking  . Thyroidectomy, partial    History   Social History  . Marital Status: Widowed    Spouse Name: N/A    Number of Children: 2  . Years of Education: N/A   Occupational History  . Retired [employer] operational officer    Social History Main Topics  . Smoking status: Never Smoker   . Smokeless tobacco: Never Used  . Alcohol Use: No  . Drug Use: No  . Sexually Active: None   Other Topics Concern  . None   Social History Narrative  . None   Family History  Problem Relation Age of Onset  . Colon cancer Neg Hx    Review of Systems: Pertinent positive and negative review of systems were noted in the above HPI section. All other review of systems were otherwise negative.  Physical Exam: General: Well developed , well nourished,  no acute distress Head: Normocephalic and atraumatic Eyes:  sclerae anicteric, EOMI Ears: Normal auditory acuity Mouth: No deformity or lesions Neck: Supple, no masses or thyromegaly Lungs: Clear throughout to auscultation Heart: Regular rate and rhythm; no murmurs, rubs or bruits Abdomen: Soft, non tender and non distended. No masses, hepatosplenomegaly or hernias noted. Normal Bowel sounds Rectal: Deferred to colonoscopy Musculoskeletal: Symmetrical with no gross deformities  Skin: No lesions on visible extremities Pulses:  Normal pulses noted Extremities: No clubbing, cyanosis, edema or deformities noted Neurological: Alert oriented x 4, grossly nonfocal Cervical Nodes:  No significant cervical adenopathy Inguinal Nodes: No significant inguinal adenopathy Psychological:  Alert and cooperative. Normal mood  and affect  Assessment and Recommendations:  1. Iron deficiency anemia, Hemoccult negative stool, history of Barrett's esophagus. Rule out celiac disease. Rule out occult sources of gastrointestinal blood loss. She is advised to take iron twice daily with meals. The risks, benefits, and alternatives to colonoscopy with possible biopsy and possible polypectomy were discussed with the patient and they consent to proceed. The risks, benefits, and alternatives to endoscopy with possible biopsy and possible dilation were discussed with the patient and they consent to proceed.   2. Chronic warfarin therapy for stable atrial fibrillation. The risks, benefits and alternatives to a 5 day hold of Coumadin were discussed with the patient she consents to proceed. Will obtain clearance from her prescribing physician.

## 2011-06-12 NOTE — Telephone Encounter (Signed)
Patient has lost the list of available dates for her procedure.  She will call back to schedule once she speaks with her daughter

## 2011-06-13 LAB — CELIAC PANEL 10
Endomysial Screen: NEGATIVE
IgA: 267 mg/dL (ref 69–380)

## 2011-06-14 ENCOUNTER — Telehealth: Payer: Self-pay

## 2011-06-14 NOTE — Telephone Encounter (Signed)
Informed patient that Dr. Waynard Edwards sent the letter back stating she can come off coumadin 4 days prior to her procedure when she decides to schedule it. Also to resume coumadin the day of the procedure after the procedure. Pt verbalized understanding. Letter to scanned into Epic.

## 2011-07-16 ENCOUNTER — Ambulatory Visit (INDEPENDENT_AMBULATORY_CARE_PROVIDER_SITE_OTHER): Payer: Medicare Other | Admitting: Internal Medicine

## 2011-07-16 ENCOUNTER — Encounter: Payer: Self-pay | Admitting: Internal Medicine

## 2011-07-16 DIAGNOSIS — N184 Chronic kidney disease, stage 4 (severe): Secondary | ICD-10-CM | POA: Insufficient documentation

## 2011-07-16 DIAGNOSIS — E785 Hyperlipidemia, unspecified: Secondary | ICD-10-CM | POA: Insufficient documentation

## 2011-07-16 DIAGNOSIS — D509 Iron deficiency anemia, unspecified: Secondary | ICD-10-CM

## 2011-07-16 DIAGNOSIS — R251 Tremor, unspecified: Secondary | ICD-10-CM

## 2011-07-16 DIAGNOSIS — R259 Unspecified abnormal involuntary movements: Secondary | ICD-10-CM

## 2011-07-16 DIAGNOSIS — Z7901 Long term (current) use of anticoagulants: Secondary | ICD-10-CM

## 2011-07-16 DIAGNOSIS — R197 Diarrhea, unspecified: Secondary | ICD-10-CM

## 2011-07-16 DIAGNOSIS — K529 Noninfective gastroenteritis and colitis, unspecified: Secondary | ICD-10-CM

## 2011-07-16 LAB — CBC WITH DIFFERENTIAL/PLATELET
Basophils Relative: 0.5 % (ref 0.0–3.0)
Eosinophils Relative: 1.8 % (ref 0.0–5.0)
HCT: 30.3 % — ABNORMAL LOW (ref 36.0–46.0)
Hemoglobin: 9.9 g/dL — ABNORMAL LOW (ref 12.0–15.0)
MCHC: 32.5 g/dL (ref 30.0–36.0)
MCV: 91.4 fl (ref 78.0–100.0)
Neutro Abs: 4.4 10*3/uL (ref 1.4–7.7)
Platelets: 231 10*3/uL (ref 150.0–400.0)
RBC: 3.32 Mil/uL — ABNORMAL LOW (ref 3.87–5.11)

## 2011-07-16 LAB — COMPREHENSIVE METABOLIC PANEL
ALT: 11 U/L (ref 0–35)
AST: 24 U/L (ref 0–37)
Alkaline Phosphatase: 74 U/L (ref 39–117)
BUN: 20 mg/dL (ref 6–23)
Calcium: 9.2 mg/dL (ref 8.4–10.5)
Creatinine, Ser: 1.7 mg/dL — ABNORMAL HIGH (ref 0.4–1.2)
Potassium: 3.7 mEq/L (ref 3.5–5.1)

## 2011-07-16 LAB — MAGNESIUM: Magnesium: 2.1 mg/dL (ref 1.5–2.5)

## 2011-07-16 LAB — CK: Total CK: 40 U/L (ref 7–177)

## 2011-07-16 MED ORDER — MIRTAZAPINE 7.5 MG HALF TABLET: 7.5000 mg | ORAL_TABLET | Freq: Every day | ORAL | Status: DC

## 2011-07-16 NOTE — Patient Instructions (Signed)
Do not discontinue  escitalopram all at once  Take 1/2 tablet daily for 4 days,  Then 1/2 tablet every other day then stop when you run out    Call the office in 10 days to two week to give [place] update on the tremor.    We are going to start a new medication for depression, insomnia and loss of appetite  called mirtazipine  7.5 mg at bedtime

## 2011-07-16 NOTE — Assessment & Plan Note (Signed)
Last LDL was 183 in Nov prior to resuming crestor  At 5 mg daily .  Checking today along with LFTs and CK given prior history of myalgias on Crestor.

## 2011-07-16 NOTE — Assessment & Plan Note (Addendum)
Etiology likely multifactorial.  Hgb 9.4 , MCV 88 , plts 226 by Nov 5 outside labs.She was reluctant to puruse workup until she feels better, but I have explained to her that the anemia may be the cause of her malasie. She has Stage 3 CKD (cr 2.0, GFR est 24)  by prior labs, and has not had a nephrology follow up in over 2 years.  Will check SPEP , erythropoietin.  She wants to  continue seeing Karen Stuart in [place]  And Dr. Russella Stuart for GI.

## 2011-07-16 NOTE — Assessment & Plan Note (Signed)
She is due for protime, which will be checked today.

## 2011-07-16 NOTE — Assessment & Plan Note (Addendum)
Cr was 2.0, GFR 24, with trace porteinuria micro al / cr ratio elevated at 173 by Nov 5 outside labs. With anemia and malaise, normal electrolytes by Nov labs.  Labs to rule out MM due.
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
## 2011-07-17 NOTE — Assessment & Plan Note (Addendum)
LDL was 183, HDL 52, trigs 126 by fasting labs on 11/5.  She resumed Crestor at 5 mg daily at that time and repeat lipids and CK are due

## 2011-07-18 LAB — PROTEIN ELECTROPHORESIS, SERUM
Albumin ELP: 59.1 % (ref 55.8–66.1)
Alpha-1-Globulin: 6.2 % — ABNORMAL HIGH (ref 2.9–4.9)
Beta 2: 5.4 % (ref 3.2–6.5)

## 2011-07-31 ENCOUNTER — Other Ambulatory Visit (INDEPENDENT_AMBULATORY_CARE_PROVIDER_SITE_OTHER): Payer: Medicare Other | Admitting: *Deleted

## 2011-07-31 DIAGNOSIS — Z7901 Long term (current) use of anticoagulants: Secondary | ICD-10-CM

## 2011-08-01 LAB — PROTIME-INR
INR: 1.95 — ABNORMAL HIGH (ref <1.50)
Prothrombin Time: 22.9 seconds — ABNORMAL HIGH (ref 11.6–15.2)

## 2011-08-17 ENCOUNTER — Ambulatory Visit (INDEPENDENT_AMBULATORY_CARE_PROVIDER_SITE_OTHER): Payer: Medicare Other | Admitting: Internal Medicine

## 2011-08-17 ENCOUNTER — Encounter: Payer: Self-pay | Admitting: Internal Medicine

## 2011-08-17 DIAGNOSIS — D509 Iron deficiency anemia, unspecified: Secondary | ICD-10-CM

## 2011-08-17 DIAGNOSIS — R55 Syncope and collapse: Secondary | ICD-10-CM

## 2011-08-17 DIAGNOSIS — R000 Tachycardia, unspecified: Secondary | ICD-10-CM

## 2011-08-17 LAB — CBC WITH DIFFERENTIAL/PLATELET
Basophils Absolute: 0 10*3/uL (ref 0.0–0.1)
Basophils Relative: 1 % (ref 0–1)
Eosinophils Relative: 3 % (ref 0–5)
MCHC: 30.2 g/dL (ref 30.0–36.0)
MCV: 93.7 fL (ref 78.0–100.0)
Neutro Abs: 4 10*3/uL (ref 1.7–7.7)
Neutrophils Relative %: 67 % (ref 43–77)
RBC: 3.47 MIL/uL — ABNORMAL LOW (ref 3.87–5.11)
WBC: 6 10*3/uL (ref 4.0–10.5)

## 2011-08-17 LAB — BASIC METABOLIC PANEL
BUN: 30 mg/dL — ABNORMAL HIGH (ref 6–23)
Chloride: 107 mEq/L (ref 96–112)
Creat: 2.03 mg/dL — ABNORMAL HIGH (ref 0.50–1.10)

## 2011-08-17 NOTE — Patient Instructions (Signed)
Please ask the RN at [facility] to check your blood pressure and pulse once a week and sned the results to me in one month.  Please also ask your RN at [facility] to draw a PT/INR at the end of February and monthly thereafter

## 2011-08-17 NOTE — Progress Notes (Signed)
Subjective:    Patient ID: Karen Stuart, female    DOB: Jan 01, 1926, 78 y.o.   MRN: [id number]  HPI  Presents aafter having a syncopal epsiode which occurred ealier this week in the setting of recent episode of diarrhea with passage of multiple loose stools.  She made it off the commode but fainted before she made it to the bedroom. Episode Occurred around 11 am .  She does not wear a Life Alert.  She woke up on the floor ,  No head or hip trauma.  Past Medical History  Diagnosis Date  . Iron deficiency anemia   . GERD (gastroesophageal reflux disease)   . Peptic stricture of esophagus   . Hypertension   . Coronary artery disease   . Myocardial infarction 2004  . Hyperlipidemia   . TIA (transient ischemic attack)   . Hypothyroidism   . Chronic renal insufficiency   . Diverticulosis   . Barrett's esophagus 06/2006  . Internal hemorrhoids   . Atrial fibrillation   . Osteoporosis    Current Outpatient Prescriptions on File Prior to Visit  Medication Sig Dispense Refill  . amLODipine (NORVASC) 10 MG tablet Take 10 mg by mouth daily.        . benazepril (LOTENSIN) 40 MG tablet Take 40 mg by mouth daily.        . Calcium Carbonate-Vitamin D (CALTRATE 600+D PO) Take 1 tablet by mouth 2 (two) times daily.        Marland Kitchen doxazosin (CARDURA) 2 MG tablet Take 2 mg by mouth at bedtime.        . ergocalciferol (VITAMIN D2) 50000 UNITS capsule Take 50,000 Units by mouth once a week.        . escitalopram (LEXAPRO) 10 MG tablet Take 10 mg by mouth daily.       . furosemide (LASIX) 40 MG tablet Take 40 mg by mouth daily.        . mirtazapine (REMERON) 7.5 mg TABS Take 0.5 tablets (7.5 mg total) by mouth at bedtime.  30 tablet  3  . rosuvastatin (CRESTOR) 10 MG tablet Take 5 mg by mouth daily.        Marland Kitchen warfarin (COUMADIN) 2.5 MG tablet Take 2.5 mg by mouth daily. 1 1/2 tablets daily         Review of Systems  Constitutional: Negative for fever, chills and unexpected weight change.  HENT: Negative  for hearing loss, ear pain, nosebleeds, congestion, sore throat, facial swelling, rhinorrhea, sneezing, mouth sores, trouble swallowing, neck pain, neck stiffness, voice change, postnasal drip, sinus pressure, tinnitus and ear discharge.   Eyes: Negative for pain, discharge, redness and visual disturbance.  Respiratory: Negative for cough, chest tightness, shortness of breath, wheezing and stridor.   Cardiovascular: Negative for chest pain, palpitations and leg swelling.  Musculoskeletal: Negative for myalgias and arthralgias.  Skin: Negative for color change and rash.  Neurological: Positive for weakness. Negative for dizziness, light-headedness and headaches.  Hematological: Negative for adenopathy.       Objective:   Physical Exam  Constitutional: She is oriented to person, place, and time. She appears well-developed and well-nourished.  HENT:  Mouth/Throat: Oropharynx is clear and moist.  Eyes: EOM are normal. Pupils are equal, round, and reactive to light. No scleral icterus.  Neck: Normal range of motion. Neck supple. No JVD present. No thyromegaly present.  Cardiovascular: Normal rate, regular rhythm, normal heart sounds and intact distal pulses.   Pulmonary/Chest: Effort normal and breath sounds  normal.  Abdominal: Soft. Bowel sounds are normal. She exhibits no mass. There is no tenderness.  Musculoskeletal: Normal range of motion. She exhibits no edema.  Lymphadenopathy:    She has no cervical adenopathy.  Neurological: She is alert and oriented to person, place, and time.  Skin: Skin is warm and dry.  Psychiatric: She has a normal mood and affect.          Assessment & Plan:

## 2011-08-19 ENCOUNTER — Encounter: Payer: Self-pay | Admitting: Internal Medicine

## 2011-08-19 DIAGNOSIS — K222 Esophageal obstruction: Secondary | ICD-10-CM | POA: Insufficient documentation

## 2011-08-19 DIAGNOSIS — I4891 Unspecified atrial fibrillation: Secondary | ICD-10-CM | POA: Insufficient documentation

## 2011-08-19 DIAGNOSIS — K5790 Diverticulosis of intestine, part unspecified, without perforation or abscess without bleeding: Secondary | ICD-10-CM | POA: Insufficient documentation

## 2011-08-19 DIAGNOSIS — I2510 Atherosclerotic heart disease of native coronary artery without angina pectoris: Secondary | ICD-10-CM | POA: Insufficient documentation

## 2011-08-19 DIAGNOSIS — G459 Transient cerebral ischemic attack, unspecified: Secondary | ICD-10-CM | POA: Insufficient documentation

## 2011-08-19 DIAGNOSIS — R55 Syncope and collapse: Secondary | ICD-10-CM | POA: Insufficient documentation

## 2011-08-19 NOTE — Assessment & Plan Note (Signed)
Secondary chronic kidney disease with stable hemoglobin over the last month despite recent episode of syncope. She is scheduled to see Dr. Lacy Duverney  in March and will likely receive her Aranesp for hgb < 10.  She has not undergone screening EGD or colonoscopy because of her chronic Coumadin use. I have sent her home with fecal occult blood test which will necessitate diagnostic colonoscopy if positive.

## 2011-08-19 NOTE — Assessment & Plan Note (Signed)
Her recent episode was unwitnessed it occurred in the setting of prolonged diarrhea. She was not orthostatic today. She did appear to be slightly dehydrated by repeat BE met done today as her creatinine had bumped from 1.7 last month to 2.0 this month. I will recommend that she decrease her Lasix to 20 mg daily and I strongly recommended that she have a life alert ambulatory for the next occasion.

## 2011-08-23 ENCOUNTER — Other Ambulatory Visit: Payer: Self-pay | Admitting: Internal Medicine

## 2011-08-23 DIAGNOSIS — D649 Anemia, unspecified: Secondary | ICD-10-CM

## 2011-08-30 ENCOUNTER — Other Ambulatory Visit (INDEPENDENT_AMBULATORY_CARE_PROVIDER_SITE_OTHER): Payer: Medicare Other | Admitting: *Deleted

## 2011-08-30 DIAGNOSIS — Z7901 Long term (current) use of anticoagulants: Secondary | ICD-10-CM

## 2011-08-30 DIAGNOSIS — R195 Other fecal abnormalities: Secondary | ICD-10-CM

## 2011-08-30 DIAGNOSIS — D649 Anemia, unspecified: Secondary | ICD-10-CM

## 2011-08-31 LAB — FECAL OCCULT BLOOD, IMMUNOCHEMICAL: Fecal Occult Bld: POSITIVE

## 2011-08-31 NOTE — Progress Notes (Signed)
Addended by: Duncan Dull on: 08/31/2011 03:31 PM   Modules accepted: Orders

## 2011-09-05 ENCOUNTER — Ambulatory Visit: Payer: Medicare Other | Admitting: Gastroenterology

## 2011-09-17 ENCOUNTER — Telehealth: Payer: Self-pay

## 2011-09-17 NOTE — Telephone Encounter (Signed)
Message copied by Jessee Avers on Mon Sep 17, 2011  8:27 AM ------      Message from: Claudette Head T      Created: Fri Sep 14, 2011  2:28 PM       Yes, we should schedule ECL directly off Coumadin. No need for office visit.            ----- Message -----         From: Jessee Avers, CMA         Sent: 09/14/2011   9:58 AM           To: Meryl Dare, MD,FACG            This patient is scheduled to see you on 09/19/11, she saw you on 06/2011 and was scheduled for a ECL off coumadin. We got approval from Dr. Waynard Edwards for patient to come off for 4 days and then somehow patient was cancelled for procedure. Pt has now switched PCP to Dr. Darrick Huntsman in [facility] and is rereferred back to [place] for iron def anemia and heme pos stools. Pt needs to be set up for ECL again off coumadin. Can patient be set up directly for these procedures? I will send the new coumadin clearance letter to Dr. Darrick Huntsman to get approval if this is ok.

## 2011-09-18 ENCOUNTER — Encounter: Payer: Self-pay | Admitting: Gastroenterology

## 2011-09-18 NOTE — Telephone Encounter (Signed)
Pt scheduled her ECL for 10/16/11 at 3:00pm and her nurse visit for 09/27/11 at 4:30pm. Pt agreed and verbalized understanding. We will obtain clearance for patients coumadin from Dr. Darrick Huntsman before procedure.

## 2011-09-19 ENCOUNTER — Ambulatory Visit: Payer: Medicare Other | Admitting: Gastroenterology

## 2011-09-27 ENCOUNTER — Ambulatory Visit (AMBULATORY_SURGERY_CENTER): Payer: Medicare Other | Admitting: *Deleted

## 2011-09-27 ENCOUNTER — Telehealth: Payer: Self-pay | Admitting: Internal Medicine

## 2011-09-27 VITALS — Ht 63.0 in | Wt 123.3 lb

## 2011-09-27 DIAGNOSIS — D509 Iron deficiency anemia, unspecified: Secondary | ICD-10-CM

## 2011-09-27 DIAGNOSIS — K921 Melena: Secondary | ICD-10-CM

## 2011-09-27 MED ORDER — MOVIPREP 100 G PO SOLR: ORAL | Status: DC

## 2011-09-27 NOTE — Telephone Encounter (Signed)
Hi Marchelle Folks,  Ok to take [name] off of her coumadin 4 days prior to her EGD./colonoscopy planned for April 15 (?)

## 2011-10-01 ENCOUNTER — Encounter: Payer: Self-pay | Admitting: Gastroenterology

## 2011-10-01 NOTE — Telephone Encounter (Signed)
Informed patient that she can come off coumadin 4 days prior to her procedure per Dr. Darrick Huntsman. Pt agreed and verbalized understanding.

## 2011-10-04 ENCOUNTER — Telehealth: Payer: Self-pay

## 2011-10-04 NOTE — Telephone Encounter (Signed)
Hi Brookelynn Hamor,  Thank you for double checking!  I will take care of prescribing the lovenox . When is the procedure to be done ? We will need to teach her how to give the dose. And sorry about the delay.  I have not adapted to the 16 places in EPIC that need to be checked on a daily basis for messages.   TT   ----- Message -----    From: Jessee Avers, CMA    Sent: 10/03/2011   1:51 PM      To: Sherlene Shams, MD Subject: RE: Coumadin suspension                        Dr Darrick Huntsman- Please see phone note from 09/27/11. We discussed patient holding coumadin at that time. In that note, it looks like we decided that she could come directly off coumadin. Just got your note today stating that you would like patient bridged with Lovenox. I assume that the note from today is what you would really prefer be done, however, with the conflicting information, I just want to be 100% sure that I give the patient the correct information to prevent any potential problems. Should you truly want the Lovenox bridge, we typically have the prescribing doc for the coumadin to assist with that. Would you mind helping [place] out with this? Thanks for your help and hope you have a wonderful day! Christie Nottingham  ----- Message -----    From: Sherlene Shams, MD    Sent: 10/03/2011   1:22 PM      To: Jessee Avers, CMA Subject: Coumadin suspension                            Hi Shyla Gayheart,.  I would prefer that she be bridged with lovenox  To mitigate risk of embolic stroke. Who will handle that?  Let kne know if you need me to call it in and for when.  ----- Message -----    From: Jessee Avers, CMA    Sent: 09/20/2011   8:03 AM      To: Sherlene Shams, MD  Sorry Upper Endoscopy/ Colonoscopy. We have scheduled her for a direct only because she was already scheduled to have a direct procedure in December 2012 off coumadin and it was never done. Will you let me know if she can come off coumadin or if she needs to be bridged  with lovenox so I can let the patient know. ----- Message -----    From: Sherlene Shams, MD    Sent: 09/19/2011   6:02 PM      To: Jessee Avers, CMA  What is an ECL and how long will she need to be off/  I just had a similarly aged patient with atrial fib stroke and die after one week without coumadin, so wondering i f we can bridge her with lovenox.  Dr. Darrick Huntsman   ----- Message -----    From: Jessee Avers, CMA    Sent: 09/14/2011   2:38 PM      To: Sherlene Shams, MD      Mindi Junker   09/14/2011 2:36 PM Letter (Out)  MRN: [id number]   Description: 78 year old female  Provider: Christie Nottingham, CMA  Department: [facility]       Clinical Letter Summary  Letters     Letter Information         Status    Jessee Avers on 09/14/2011 Sent           Patient Demographics       Address Phone    [address] [phone] [facility]    [place] [address]

## 2011-10-05 ENCOUNTER — Other Ambulatory Visit: Payer: Self-pay | Admitting: Internal Medicine

## 2011-10-05 DIAGNOSIS — I4891 Unspecified atrial fibrillation: Secondary | ICD-10-CM

## 2011-10-05 MED ORDER — ENOXAPARIN SODIUM 60 MG/0.6ML ~~LOC~~ SOLN: 60.0000 mg | Freq: Two times a day (BID) | SUBCUTANEOUS | Status: DC

## 2011-10-09 ENCOUNTER — Encounter: Payer: Self-pay | Admitting: Internal Medicine

## 2011-10-09 ENCOUNTER — Telehealth: Payer: Self-pay | Admitting: Internal Medicine

## 2011-10-09 ENCOUNTER — Telehealth: Payer: Self-pay | Admitting: Gastroenterology

## 2011-10-09 NOTE — Telephone Encounter (Signed)
Spoke with Angelica Chessman and she states that Dr. Darrick Huntsman called Lovenox to patient's pharmacy and that she  will be showing patient how to give herself Lovenox injections. Just wanted to let Dr. Russella Dar know before her procedures next week.

## 2011-10-09 NOTE — Telephone Encounter (Signed)
Mandy at [facility] called and wanted to know when should patient start back on coumadin after the procedure and when should the next PT/INR be drawn.  Please advise.

## 2011-10-09 NOTE — Telephone Encounter (Signed)
Typically we can resume the coumadin the following day and the PT/INR one week later

## 2011-10-09 NOTE — Telephone Encounter (Signed)
Angelica Chessman called me back on patients Lovenox injections.  [facility] will administer and I have given them the order:  Stop coumadin 4/12, administer Lovenox twice daily starting 4/13, last dose given 4/15 pm.     Angelica Chessman stated patient just gave her the order yesterday that you printed 2/28 for [facility] to do monthly PT/INR draws and weekly BP checks.  Angelica Chessman stated she will start this today.

## 2011-10-11 ENCOUNTER — Encounter: Payer: Self-pay | Admitting: Internal Medicine

## 2011-10-11 NOTE — Telephone Encounter (Signed)
The order has been sent to [facility]

## 2011-10-15 ENCOUNTER — Telehealth: Payer: Self-pay | Admitting: Gastroenterology

## 2011-10-15 NOTE — Telephone Encounter (Signed)
Patient phoned in stating she has had diarrhea since yesterday afternoon. She has had 3-4 episodes this morning. Reports stools are quite dark. Denies vomiting, fever or pain. Scheduled for [facility] tomorrow with Dr. Russella Dar. Spoke with her primary care MD who informed her she may only want to take part of her prep. Note sent to Dr. Russella Dar to advise of prep and continuation of scheduled procedure.

## 2011-10-15 NOTE — Telephone Encounter (Signed)
Per Dr. Russella Dar, okay to proceed with [facility] 10-16-2011. Pt to take all of her prep as previously instructed. Pt understands and agrees with plan.

## 2011-10-16 ENCOUNTER — Ambulatory Visit (AMBULATORY_SURGERY_CENTER): Payer: Medicare Other | Admitting: Gastroenterology

## 2011-10-16 ENCOUNTER — Encounter: Payer: Self-pay | Admitting: Gastroenterology

## 2011-10-16 VITALS — BP 164/79 | HR 70 | Temp 95.4°F | Resp 19 | Ht 63.0 in | Wt 123.0 lb

## 2011-10-16 DIAGNOSIS — K219 Gastro-esophageal reflux disease without esophagitis: Secondary | ICD-10-CM

## 2011-10-16 DIAGNOSIS — K222 Esophageal obstruction: Secondary | ICD-10-CM

## 2011-10-16 DIAGNOSIS — K921 Melena: Secondary | ICD-10-CM

## 2011-10-16 DIAGNOSIS — D509 Iron deficiency anemia, unspecified: Secondary | ICD-10-CM

## 2011-10-16 DIAGNOSIS — K2270 Barrett's esophagus without dysplasia: Secondary | ICD-10-CM

## 2011-10-16 DIAGNOSIS — A048 Other specified bacterial intestinal infections: Secondary | ICD-10-CM

## 2011-10-16 DIAGNOSIS — R195 Other fecal abnormalities: Secondary | ICD-10-CM

## 2011-10-16 MED ORDER — SODIUM CHLORIDE 0.9 % IV SOLN: 500.0000 mL | INTRAVENOUS | Status: DC

## 2011-10-16 NOTE — Patient Instructions (Signed)
YOU HAD AN ENDOSCOPIC PROCEDURE TODAY AT THE [facility]: Refer to the procedure report that was given to you for any specific questions about what was found during the examination.  If the procedure report does not answer your questions, please call your gastroenterologist to clarify.  If you requested that your care partner not be given the details of your procedure findings, then the procedure report has been included in a sealed envelope for you to review at your convenience later.  YOU SHOULD EXPECT: Some feelings of bloating in the abdomen. Passage of more gas than usual.  Walking can help get rid of the air that was put into your GI tract during the procedure and reduce the bloating. If you had a lower endoscopy (such as a colonoscopy or flexible sigmoidoscopy) you may notice spotting of blood in your stool or on the toilet paper. If you underwent a bowel prep for your procedure, then you may not have a normal bowel movement for a few days.  DIET: Your first meal following the procedure should be a light meal and then it is ok to progress to your normal diet.  A half-sandwich or bowl of soup is an example of a good first meal.  Heavy or fried foods are harder to digest and may make you feel nauseous or bloated.  Likewise meals heavy in dairy and vegetables can cause extra gas to form and this can also increase the bloating.  Drink plenty of fluids but you should avoid alcoholic beverages for 24 hours.  ACTIVITY: Your care partner should take you home directly after the procedure.  You should plan to take it easy, moving slowly for the rest of the day.  You can resume normal activity the day after the procedure however you should NOT DRIVE or use heavy machinery for 24 hours (because of the sedation medicines used during the test).    SYMPTOMS TO REPORT IMMEDIATELY: A gastroenterologist can be reached at any hour.  During normal business hours, 8:30 AM to 5:00 PM Monday through Friday,  call [phone].  After hours and on weekends, please call the GI answering service at [phone] who will take a message and have the physician on call contact you.   Following lower endoscopy (colonoscopy or flexible sigmoidoscopy):  Excessive amounts of blood in the stool  Significant tenderness or worsening of abdominal pains  Swelling of the abdomen that is new, acute  Fever of 100F or higher   Following upper endoscopy (EGD)  Vomiting of blood or coffee ground material New chest pain or pain under the shoulder blades  Painful or persistently difficult swallowing  New shortness of breath  Fever of 100F or higher  Black, tarry-looking stools  FOLLOW UP: If any biopsies were taken you will be contacted by phone or by letter within the next 1-3 weeks.  Call your gastroenterologist if you have not heard about the biopsies in 3 weeks.  Our staff will call the home number listed on your records the next business day following your procedure to check on you and address any questions or concerns that you may have at that time regarding the information given to you following your procedure. This is a courtesy call and so if there is no answer at the home number and we have not heard from you through the emergency physician on call, we will assume that you have returned to your regular daily activities without incident.  SIGNATURES/CONFIDENTIALITY: You and/or your care  partner have signed paperwork which will be entered into your electronic medical record.  These signatures attest to the fact that that the information above on your After Visit Summary has been reviewed and is understood.  Full responsibility of the confidentiality of this discharge information lies with you and/or your care-partner.     Be sure to eat a low acid diet due to your reflux.  Take your new medication (omeprazole 20mg  1/2 hr. Before breakfast everyday.) Resume your coumadin today, and have your INR/PT  checked in 1 week.

## 2011-10-16 NOTE — Progress Notes (Signed)
Per pt, her last dose of Coumadin was 10-12-11.  Her last Lovenox injection was 10-15-11.

## 2011-10-16 NOTE — Op Note (Signed)
[facility] [address] [place], [place]  [address]  ENDOSCOPY PROCEDURE REPORT PATIENT:  Karen Stuart, Karen Stuart  MR#:  [id number] BIRTHDATE:  Nov 27, 1925, 85 yrs. old  GENDER:  female ENDOSCOPIST:  Judie Petit T. Russella Dar, MD, [facility]  PROCEDURE DATE:  10/16/2011 PROCEDURE:  EGD with biopsy, 43239 ASA CLASS:  Class III INDICATIONS:  hemoccult positive stool, iron deficiency anemia, h/o Barrett's Esophagus MEDICATIONS:  MAC sedation, administered by CRNA, There was residual sedation effect present, propofol (Diprivan) 180 mg IV TOPICAL ANESTHETIC:  none DESCRIPTION OF PROCEDURE:   After the risks benefits and alternatives of the procedure were thoroughly explained, informed consent was obtained.  The LB GIF-H180 [id number] endoscope was introduced through the mouth and advanced to the second portion of the duodenum, without limitations.  The instrument was slowly withdrawn as the mucosa was fully examined. <<PROCEDUREIMAGES>> A stricture was found at the gastroesophageal junction. It was benign appearing and circumferential. It was 14 mm in diameter. Multiple biopsies were obtained and sent to pathology.  A nodule was found at the gastroesophageal junction. It was erythematous. It was 4 mm in size. Multiple biopsies were obtained and sent to pathology.  Otherwise normal esophagus.  Mild gastritis was found in the antrum. It was erosive and erythematous. Multiple biopsies were obtained and sent to pathology.  Otherwise normal stomach. The duodenal bulb was normal in appearance, as was the postbulbar duodenum.  Retroflexed views revealed a hiatal hernia, small.  The scope was then withdrawn from the patient and the procedure completed. COMPLICATIONS:  None  ENDOSCOPIC IMPRESSION: 1) Stricture at the gastroesophageal junction 2) Nodule at the gastroesophageal junction 3) Mild gastritis 4) Small hiatal hernia  RECOMMENDATIONS: 1) Anti-reflux regimen 2) Await pathology results 3)  PPI qam long term: omeprazole 20 mg po qam, #30, 11 refills 4) Erosive gastritis or EGJ nodule are both potential sources of fe def anemia and heme + stool  Camella Seim T. Russella Dar, MD, [facility]  CC:  Duncan Dull, MD  n. Rosalie DoctorVenita Lick. Anala Whisenant at 10/16/2011 03:58 PM  Fidela Salisbury, [phone]

## 2011-10-16 NOTE — Op Note (Signed)
[facility] [address] [place], [place]  [address]  COLONOSCOPY PROCEDURE REPORT  PATIENT:  Karen Stuart, Karen Stuart  MR#:  [id number] BIRTHDATE:  1925/11/26, 85 yrs. old  GENDER:  female ENDOSCOPIST:  Judie Petit T. Russella Dar, MD, [facility]  PROCEDURE DATE:  10/16/2011 PROCEDURE:  Colonoscopy 57846 ASA CLASS:  Class III INDICATIONS:  1) Iron deficiency anemia  2) heme positive stool MEDICATIONS:   MAC sedation, administered by CRNA, propofol (Diprivan) 60 mg IV DESCRIPTION OF PROCEDURE:   After the risks benefits and alternatives of the procedure were thoroughly explained, informed consent was obtained.  Digital rectal exam was performed and revealed no abnormalities.   The LB [id number] endoscope was introduced through the anus and advanced to the cecum, which was identified by both the appendix and ileocecal valve, without limitations.  The quality of the prep was good, using MoviPrep. The instrument was then slowly withdrawn as the colon was fully examined. <<PROCEDUREIMAGES>> FINDINGS:  Moderate diverticulosis was found in the sigmoid colon. Otherwise normal colonoscopy without other polyps, masses, vascular ectasias, or inflammatory changes.   Retroflexed views in the rectum revealed internal hemorrhoids, small.  The time to cecum =  2.75  minutes. The scope was then withdrawn (time =  8.67 min) from the patient and the procedure completed.  COMPLICATIONS:  None  ENDOSCOPIC IMPRESSION: 1) Moderate diverticulosis in the sigmoid colon 2) Internal hemorrhoids  RECOMMENDATIONS: 1) High fiber diet with liberal fluid intake. 2) Heme + stool could be from hemorrhoids but no explanation for fe def anemia 3) Resume Coumadin (warfarin) today and have your PT/INR checked within 1 week. 4) Given your age, you will not need another colonoscopy for colon cancer screening or polyp surveillance. These types of tests usually stop around the age 39.  Venita Lick. Russella Dar, MD,  [facility]  CC:  Duncan Dull, MD  n. Rosalie DoctorVenita Lick. Zailah Zagami at 10/16/2011 03:41 PM  Fidela Salisbury, [phone]

## 2011-10-17 ENCOUNTER — Telehealth: Payer: Self-pay | Admitting: *Deleted

## 2011-10-17 NOTE — Telephone Encounter (Signed)
  Follow up Call-  Call back number 10/16/2011  Post procedure Call Back phone  # [phone]  Permission to leave phone message Yes     Patient questions:  Do you have a fever, pain , or abdominal swelling? no Pain Score  0 *  Have you tolerated food without any problems? yes  Have you been able to return to your normal activities? yes  Do you have any questions about your discharge instructions: Diet   no Medications  no Follow up visit  no  Do you have questions or concerns about your Care? no  Actions: * If pain score is 4 or above: No action needed, pain <4.

## 2011-10-22 ENCOUNTER — Telehealth: Payer: Self-pay

## 2011-10-22 ENCOUNTER — Encounter: Payer: Self-pay | Admitting: Gastroenterology

## 2011-10-22 MED ORDER — BIS SUBCIT-METRONID-TETRACYC 140-125-125 MG PO CAPS: 3.0000 | ORAL_CAPSULE | Freq: Four times a day (QID) | ORAL | Status: DC

## 2011-10-22 MED ORDER — OMEPRAZOLE 20 MG PO CPDR: 20.0000 mg | DELAYED_RELEASE_CAPSULE | Freq: Every day | ORAL | Status: AC

## 2011-10-22 NOTE — Telephone Encounter (Signed)
Patient advised.  She was advised by Dr Russella Dar at the time of her procedure to start omeprazole.  She states it is very expensive and asks for a rx.  The patient is advised

## 2011-10-22 NOTE — Telephone Encounter (Signed)
Message copied by Annett Fabian on Mon Oct 22, 2011  4:20 PM ------      Message from: Claudette Head T      Created: Mon Oct 22, 2011  1:43 PM       H pylori gastritis on path:      PPI BID for 10 days      Pylera 3 po qid for 10 days

## 2011-10-23 ENCOUNTER — Telehealth: Payer: Self-pay | Admitting: Internal Medicine

## 2011-10-23 NOTE — Telephone Encounter (Signed)
Nurse faxed a note over on Karen Stuart her PT/INR was low . Wanted to know what should be done for the patient.

## 2011-10-24 ENCOUNTER — Telehealth: Payer: Self-pay | Admitting: Internal Medicine

## 2011-10-24 NOTE — Telephone Encounter (Signed)
Her coumadin level is low. On 2.5 mg daily   How many does has she been back on it?( had EGD 4/22)

## 2011-10-24 NOTE — Telephone Encounter (Signed)
Nurse notified in another phone note.

## 2011-10-24 NOTE — Telephone Encounter (Signed)
Mandy notified, she stated patient started back 2.5 mg on 4/23

## 2011-10-25 ENCOUNTER — Telehealth: Payer: Self-pay | Admitting: Gastroenterology

## 2011-10-25 NOTE — Telephone Encounter (Signed)
Patient states she doesn't understand why she has 120 tablets of this medication given to treat H. Pylori. Told patient that she needs to take 3 tablets by mouth four times a day. Pt states she has only been taking one tablet 4 times a day since Monday. Told patient that she needs to take it the correct way 3 tablets 4 times a day from now on x 10 days. I also told her to take her Prilosec twice daily x 10 days. Patient asked several questions regarding H. Pylori and causes for the disease. Pt's questions were answered. Told patient to call back if she has any other questions. Pt agreed and verbalized understanding.

## 2011-10-26 ENCOUNTER — Telehealth: Payer: Self-pay | Admitting: Gastroenterology

## 2011-10-26 NOTE — Telephone Encounter (Signed)
Patient states starting this morning after taking her Pylera on the correct dose 3 capsule by mouth 4 x daily patient had a increase in acid reflux symptoms. Asked patient what she had to eat this morning and she states she had coffee. Told patient to continue her treatment for H. Pylori and omeprazole bid, gave her anti-reflux measures and told her to take Tums for breakthrough symptoms. Also told her after she finishes the treatment for H. Pylori and she is still having acid reflux then call us back. Pt agreed and verbalized understanding.

## 2011-10-29 ENCOUNTER — Telehealth: Payer: Self-pay | Admitting: Gastroenterology

## 2011-10-29 NOTE — Telephone Encounter (Signed)
They can DC or leave order in place saying patient refused. Can we give orders at this facility or does she have an institutional provider that is allowed to give orders?

## 2011-10-29 NOTE — Telephone Encounter (Signed)
Denna advised.  They will continue to document the patient refused

## 2011-10-29 NOTE — Telephone Encounter (Signed)
Patient's nurse Deanna at the assisted living has called and states that the patient refuses to complete her Pylera rx.  She has 5 days left.  She has been taking it incorrectly (see previous phone notes).  I have asked the nurse to explain that the medication needs to be completed to kill h Pylori and that she should complete the treatment.  She refuses to complete the tx.  Dr Russella Dar they are requesting an order to D/C the medication.  Please Francee Piccolo

## 2011-10-31 ENCOUNTER — Encounter: Payer: Self-pay | Admitting: Internal Medicine

## 2011-11-04 ENCOUNTER — Emergency Department: Payer: Self-pay | Admitting: Unknown Physician Specialty

## 2011-11-04 LAB — COMPREHENSIVE METABOLIC PANEL
Albumin: 3.5 g/dL (ref 3.4–5.0)
Alkaline Phosphatase: 60 U/L (ref 50–136)
Anion Gap: 8 (ref 7–16)
Bilirubin,Total: 0.4 mg/dL (ref 0.2–1.0)
Calcium, Total: 9.3 mg/dL (ref 8.5–10.1)
Chloride: 108 mmol/L — ABNORMAL HIGH (ref 98–107)
Creatinine: 1.8 mg/dL — ABNORMAL HIGH (ref 0.60–1.30)
EGFR (African American): 30 — ABNORMAL LOW
SGPT (ALT): 13 U/L
Sodium: 140 mmol/L (ref 136–145)
Total Protein: 6.7 g/dL (ref 6.4–8.2)

## 2011-11-04 LAB — CBC
HCT: 28.8 % — ABNORMAL LOW (ref 35.0–47.0)
HGB: 9.2 g/dL — ABNORMAL LOW (ref 12.0–16.0)
MCH: 29 pg (ref 26.0–34.0)
MCHC: 32 g/dL (ref 32.0–36.0)
MCV: 91 fL (ref 80–100)
Platelet: 205 10*3/uL (ref 150–440)
RDW: 15.7 % — ABNORMAL HIGH (ref 11.5–14.5)
WBC: 4.9 10*3/uL (ref 3.6–11.0)

## 2011-11-04 LAB — PROTIME-INR: Prothrombin Time: 37.2 secs — ABNORMAL HIGH (ref 11.5–14.7)

## 2011-11-07 ENCOUNTER — Telehealth: Payer: Self-pay | Admitting: Internal Medicine

## 2011-11-07 NOTE — Telephone Encounter (Signed)
[phone] Morrie Sheldon at [facility] called  Mrs [name] called Morrie Sheldon she is having real bad diarrhea went to [facility] over  The weekend. They told her it was just diarrhea and to keep hydrated. Pt would like to know what to do for this Please call ms thompson @ [phone]

## 2011-11-08 ENCOUNTER — Telehealth: Payer: Self-pay | Admitting: Gastroenterology

## 2011-11-08 NOTE — Telephone Encounter (Signed)
Patient was seen in the ER at [facility] for the last few weeks.  She was given fluids for dehydration.  She is asking for something for the diarrhea.  She has multiple episodes during the day.  She says they didn't tell her anything at the ER this weekend.  I have recommended she see Willette Cluster RNP today or tomorrow.  She states that she doesn't have a way to get here.  I have recommended a bland diet and imodium.  She will call back if this doesn't work for an appt with Willette Cluster RNP tomorrow

## 2011-11-08 NOTE — Telephone Encounter (Signed)
Patient has a colonoscopy on April 14th or 16th and since then she has had soft stools.  She stated she is not making it to the bathroom in time and is soiling all of her clothes.  She stated she has contacted the doctor who did the colonoscopy and he gave her a medication called pylera and she was supposed to take it for 10 days.  She finished the medication but is still having loose stools.  I advised that she call the doctor back that did the colonoscopy and let him know that after finishing the medication she is still having loose stools.  I also advised that she stay well hydrated with clear liquids and to call back if the GI doctor did not treat her again.

## 2011-11-08 NOTE — Telephone Encounter (Signed)
Sheri please call pt re diarrhea

## 2011-11-08 NOTE — Telephone Encounter (Signed)
Agree. She needs an office visit with [place] or her PCP if she can't find a way to get here

## 2011-11-09 ENCOUNTER — Telehealth: Payer: Self-pay | Admitting: Internal Medicine

## 2011-11-09 NOTE — Telephone Encounter (Signed)
Patient has an appt next Friday, she has not been getting her PT/INR's drawn.

## 2011-11-09 NOTE — Telephone Encounter (Addendum)
Patient has not come in for her PT/INR's in the last week at [facility] ,stating it is done to early in the morning. Patient has an appointment coming up, Morrie Sheldon from [facility] thought she could have it done at her appointment or schedule her a lab visit.

## 2011-11-09 NOTE — Telephone Encounter (Signed)
Yes we can do them at her visit

## 2011-11-16 ENCOUNTER — Ambulatory Visit (INDEPENDENT_AMBULATORY_CARE_PROVIDER_SITE_OTHER): Payer: Medicare Other | Admitting: Internal Medicine

## 2011-11-16 ENCOUNTER — Encounter: Payer: Self-pay | Admitting: Internal Medicine

## 2011-11-16 VITALS — BP 142/60 | HR 70 | Temp 98.3°F | Resp 16 | Ht 63.0 in | Wt 121.0 lb

## 2011-11-16 DIAGNOSIS — Z7901 Long term (current) use of anticoagulants: Secondary | ICD-10-CM

## 2011-11-16 DIAGNOSIS — I2510 Atherosclerotic heart disease of native coronary artery without angina pectoris: Secondary | ICD-10-CM

## 2011-11-16 DIAGNOSIS — K222 Esophageal obstruction: Secondary | ICD-10-CM

## 2011-11-16 DIAGNOSIS — D509 Iron deficiency anemia, unspecified: Secondary | ICD-10-CM

## 2011-11-16 LAB — COMPREHENSIVE METABOLIC PANEL
ALT: 9 U/L (ref 0–35)
AST: 20 U/L (ref 0–37)
Albumin: 3.6 g/dL (ref 3.5–5.2)
BUN: 28 mg/dL — ABNORMAL HIGH (ref 6–23)
CO2: 24 mEq/L (ref 19–32)
Calcium: 9.5 mg/dL (ref 8.4–10.5)
Chloride: 108 mEq/L (ref 96–112)
Potassium: 3.9 mEq/L (ref 3.5–5.1)

## 2011-11-16 LAB — CBC WITH DIFFERENTIAL/PLATELET
Basophils Relative: 0.5 % (ref 0.0–3.0)
Eosinophils Absolute: 0.2 10*3/uL (ref 0.0–0.7)
Lymphocytes Relative: 22.6 % (ref 12.0–46.0)
MCHC: 31.6 g/dL (ref 30.0–36.0)
Monocytes Relative: 9.3 % (ref 3.0–12.0)
Neutrophils Relative %: 64.4 % (ref 43.0–77.0)
RBC: 2.91 Mil/uL — ABNORMAL LOW (ref 3.87–5.11)
WBC: 5.1 10*3/uL (ref 4.5–10.5)

## 2011-11-16 LAB — IRON AND TIBC

## 2011-11-16 LAB — MAGNESIUM: Magnesium: 2.1 mg/dL (ref 1.5–2.5)

## 2011-11-16 LAB — FERRITIN: Ferritin: 8.5 ng/mL — ABNORMAL LOW (ref 10.0–291.0)

## 2011-11-16 LAB — PROTIME-INR: INR: 2.22 — ABNORMAL HIGH (ref <1.50)

## 2011-11-16 NOTE — Assessment & Plan Note (Signed)
Worsening, with heme positive stool.  Erosive gastritis and gastric nodule seen on EGD.  hgb has dropped to 8.2 despite iron supplementation.  Will stop coumadin and refer to Hematology for treatment of anemia with IV iron and transfusion.

## 2011-11-16 NOTE — Progress Notes (Signed)
Patient ID: Karen Stuart, female   DOB: 25-Jan-1926, 78 y.o.   MRN: [id number]  Patient Active Problem List  Diagnoses  . Iron deficiency anemia, unspecified  . Encounter for long-term (current) use of anticoagulants  . Kidney disease, chronic, stage IV (severe, EGFR 15-29 ml/min)  . Other and unspecified hyperlipidemia  . Hyperlipidemia LDL goal < 70  . Syncope and collapse  . Atrial fibrillation  . Diverticulosis  . Peptic stricture of esophagus  . TIA (transient ischemic attack)  . Coronary artery disease    Subjective:  CC:   Chief Complaint  Patient presents with  . Follow-up    HPI:   Karen Billet Thompsonis a 78 y.o. female who presents with the cc of "I just can't get well." patient had diarrhea for three weeks, ending on Tuesday or Wednesday during which time she suffered from fecal incontinence repeatedly.  She underwent endoscopy last month and was treated by Dr. Russella Dar for erosive gastritis, stricture and nodule secondary to  H Pylori which she had trouble complying with but eventually finished.  She has been taking oral iron since March when the anemia was discovered to be iron deficient.   Spent a night at [facility]  But didn't stay more than one night because she preferred to be in her own home.  Now eating regular diet and a regular formed stool..  Want to continue independent living but has been too weak to go grocery shopping .  She denies chest pain and shortness of breath but endorses generalized weakness and persistent fatigue.  She had a skin cancer. Removed  from her left mandible by Diona Browner yesterday and is requesting a bandage change.   Past Medical History  Diagnosis Date  . Iron deficiency anemia   . GERD (gastroesophageal reflux disease)   . Peptic stricture of esophagus   . Hypertension   . Coronary artery disease   . Myocardial infarction 2004  . Hyperlipidemia   . TIA (transient ischemic attack)   . Hypothyroidism   . Chronic renal  insufficiency   . Diverticulosis   . Barrett's esophagus 06/2006  . Internal hemorrhoids   . Atrial fibrillation   . Osteoporosis     Past Surgical History  Procedure Date  . Cholecystectomy 1996  . Partial hysterectomy 1990    w/ bladder tacking  . Thyroidectomy, partial          The following portions of the patient's history were reviewed and updated as appropriate: Allergies, current medications, and problem list.    Review of Systems:   12 Pt  review of systems was negative except those addressed in the HPI,     History   Social History  . Marital Status: Widowed    Spouse Name: N/A    Number of Children: 2  . Years of Education: N/A   Occupational History  . Retired [employer] operational officer    Social History Main Topics  . Smoking status: Never Smoker   . Smokeless tobacco: Never Used  . Alcohol Use: No  . Drug Use: No  . Sexually Active: Not on file   Other Topics Concern  . Not on file   Social History Narrative  . No narrative on file    Objective:  BP 142/60  Pulse 70  Temp(Src) 98.3 F (36.8 C) (Oral)  Resp 16  Ht 5\' 3"  (1.6 m)  Wt 121 lb (54.885 kg)  BMI 21.43 kg/m2  SpO2 97%  General appearance: alert,  cooperative and appears stated age Ears: normal TM's and external ear canals both ears Throat: lips, mucosa, and tongue normal; teeth and gums normal Neck: no adenopathy, no carotid bruit, supple, symmetrical, trachea midline and thyroid not enlarged, symmetric, no tenderness/mass/nodules Back: symmetric, no curvature. ROM normal. No CVA tenderness. Lungs: clear to auscultation bilaterally Heart: regular rate and rhythm, S1, S2 normal, no murmur, click, rub or gallop Abdomen: soft, non-tender; bowel sounds normal; no masses,  no organomegaly Pulses: 2+ and symmetric Skin: Skin color, texture, turgor normal. No rashes or lesions Lymph nodes: Cervical, supraclavicular, and axillary nodes normal.  Assessment and Plan:  Iron deficiency anemia,  unspecified Worsening, with heme positive stool.  Erosive gastritis and gastric nodule seen on EGD.  hgb has dropped to 8.2 despite iron supplementation.  Will stop coumadin and refer to Hematology for treatment of anemia with IV iron and transfusion.   Coronary artery disease Continue current medications. She is currently asymptomatic with regard to chest pain. Her current malaise and fatigue can be explained by her significant low hemoglobin of 8.2 which was determined today to be lower than one month ago. She has had some GI losses by recent endoscopic workup and is known to taking aspirin or Coumadin .    Updated Medication List Outpatient Encounter Prescriptions as of 11/16/2011  Medication Sig Dispense Refill  . amLODipine (NORVASC) 10 MG tablet Take 10 mg by mouth daily.        . benazepril (LOTENSIN) 40 MG tablet Take 40 mg by mouth daily.        . Calcium Carbonate-Vitamin D (CALTRATE 600+D PO) Take 1 tablet by mouth 2 (two) times daily.        Marland Kitchen doxazosin (CARDURA) 2 MG tablet Take 2 mg by mouth at bedtime.        . ergocalciferol (VITAMIN D2) 50000 UNITS capsule Take 50,000 Units by mouth once a week.        . furosemide (LASIX) 40 MG tablet Take 40 mg by mouth daily.        Marland Kitchen omeprazole (PRILOSEC) 20 MG capsule Take 1 capsule (20 mg total) by mouth daily.  30 capsule  11  . rosuvastatin (CRESTOR) 10 MG tablet Take 5 mg by mouth daily.        Marland Kitchen DISCONTD: warfarin (COUMADIN) 2.5 MG tablet Take 2.5 mg by mouth daily. 1 1/2 tablets daily       . bismuth-metronidazole-tetracycline (PYLERA) 140-125-125 MG per capsule Take 3 capsules by mouth 4 (four) times daily.  120 capsule  0  . DISCONTD: enoxaparin (LOVENOX) 60 MG/0.6ML injection Inject 0.6 mLs (60 mg total) into the skin every 12 (twelve) hours.  6 Syringe  0  . DISCONTD: MOVIPREP 100 G SOLR Moviprep as directed  1 kit  0     Orders Placed This Encounter  Procedures  . Ferritin  . Iron Binding Cap (TIBC)  . Magnesium  . CBC  with Differential  . Comp Met (CMET)  . INR/PT  . Ambulatory referral to Hematology    No Follow-up on file.

## 2011-11-16 NOTE — Patient Instructions (Signed)
I am checking your labs today to see if need to continue your iron.    I am referring you for Physical Therapy at [facility] to help regain your strength.   Return in 3 months

## 2011-11-18 NOTE — Assessment & Plan Note (Signed)
Continue current medications. She is currently asymptomatic with regard to chest pain. Her current malaise and fatigue can be explained by her significant low hemoglobin of 8.2 which was determined today to be lower than one month ago. She has had some GI losses by recent endoscopic workup and is known to taking aspirin or Coumadin .

## 2011-11-21 ENCOUNTER — Telehealth: Payer: Self-pay | Admitting: Internal Medicine

## 2011-11-21 NOTE — Telephone Encounter (Signed)
The referral for Hematology is in EPIC.  Karen Stuart or Marg should be handling.  If she has not been given an appt,  Let us know .She does not need to resume coumadin because she is lisng blood through her GI tract.

## 2011-11-21 NOTE — Telephone Encounter (Signed)
Patient is worried because she has been off of there coumadin for 5 days and thinks she should be on it.  Please advise on what exactly patient should be doing now.

## 2011-11-21 NOTE — Telephone Encounter (Signed)
[phone] Mandy @ [facility] called ms thompson called her questioning about meds She had been off coumdian for 5 days and she was supposed to start an iron drip. Angelica Chessman is wanting to know what she needs to do please advise

## 2011-11-22 ENCOUNTER — Telehealth: Payer: Self-pay | Admitting: Internal Medicine

## 2011-11-22 ENCOUNTER — Encounter: Payer: Self-pay | Admitting: Internal Medicine

## 2011-11-22 ENCOUNTER — Ambulatory Visit (INDEPENDENT_AMBULATORY_CARE_PROVIDER_SITE_OTHER): Payer: Medicare Other | Admitting: Internal Medicine

## 2011-11-22 VITALS — BP 146/62 | HR 77 | Temp 97.8°F | Resp 14 | Wt 121.5 lb

## 2011-11-22 DIAGNOSIS — C4A30 Merkel cell carcinoma of unspecified part of face: Secondary | ICD-10-CM | POA: Insufficient documentation

## 2011-11-22 DIAGNOSIS — D649 Anemia, unspecified: Secondary | ICD-10-CM

## 2011-11-22 DIAGNOSIS — N184 Chronic kidney disease, stage 4 (severe): Secondary | ICD-10-CM

## 2011-11-22 DIAGNOSIS — I2510 Atherosclerotic heart disease of native coronary artery without angina pectoris: Secondary | ICD-10-CM

## 2011-11-22 DIAGNOSIS — D509 Iron deficiency anemia, unspecified: Secondary | ICD-10-CM

## 2011-11-22 LAB — CBC WITH DIFFERENTIAL/PLATELET
Basophils Relative: 0.7 % (ref 0.0–3.0)
Eosinophils Relative: 1.3 % (ref 0.0–5.0)
HCT: 27 % — ABNORMAL LOW (ref 36.0–46.0)
Hemoglobin: 8.6 g/dL — ABNORMAL LOW (ref 12.0–15.0)
Lymphs Abs: 0.9 10*3/uL (ref 0.7–4.0)
Monocytes Relative: 8.9 % (ref 3.0–12.0)
Neutro Abs: 3.9 10*3/uL (ref 1.4–7.7)
RDW: 16.7 % — ABNORMAL HIGH (ref 11.5–14.6)
WBC: 5.4 10*3/uL (ref 4.5–10.5)

## 2011-11-22 MED ORDER — FERROUS FUM-IRON POLYSACCH 162-115.2 MG PO CAPS: 1.0000 | ORAL_CAPSULE | Freq: Two times a day (BID) | ORAL | Status: AC

## 2011-11-22 NOTE — Telephone Encounter (Signed)
Mandy at [facility] wants notes faxed to her from Ms. Thompson's visit today. Fax # [phone].

## 2011-11-22 NOTE — Assessment & Plan Note (Signed)
By recent biopsy of 1.5 cm mass on left mandible.  Prognosis is poor. Dr. Purcell Nails is recommending referral to [facility] for  Moh's procedure followed by XRT .  Patient is agreeable but willl need transportation provided by [facility].

## 2011-11-22 NOTE — Assessment & Plan Note (Addendum)
Followed by Dr. Lacy Duverney in [facility] for GFR < 30. Given her low iron stores, aranesp would mot likely help until her iron is replenished.

## 2011-11-22 NOTE — Assessment & Plan Note (Signed)
She has a history of an acute MI in 2004 .  No recent cardiology follow up. Not symptomatic currently.

## 2011-11-22 NOTE — Assessment & Plan Note (Addendum)
Secondary to GI losses, complicated by use of coumadin and  CKD Stage IV. Discussed with patient need to take oral iron twice daily for one week and assess response of bone marrow with repeat hgb.  If no imporvement,  Dr. Neale Burly will see her for IV iron and/or transfusion.  She would meet criteria for transfusion given her history of GI blood losses and history of CAD.

## 2011-11-22 NOTE — Progress Notes (Signed)
Patient ID: Karen Stuart, female   DOB: Oct 09, 1925, 78 y.o.   MRN: [id number]  Patient Active Problem List  Diagnoses  . Iron deficiency anemia, unspecified  . Encounter for long-term (current) use of anticoagulants  . Kidney disease, chronic, stage IV (severe, EGFR 15-29 ml/min)  . Other and unspecified hyperlipidemia  . Hyperlipidemia LDL goal < 70  . Syncope and collapse  . Atrial fibrillation  . Diverticulosis  . Peptic stricture of esophagus  . TIA (transient ischemic attack)  . Coronary artery disease  . Merkel cell carcinoma of face    Subjective:  CC:   No chief complaint on file.   HPI:   Karen Locust Thompsonis a 78 y.o. female with a history of anemia, Barrett's esophagus, H Pylori gastritis, CKD, and CAD who presents for follow up on iron deficiency anema with repeat hgb . She was prescribed oral iron in March. She has had a continued decline with symptoms of fatigue and generalized weakness without chest pain or shortness of breath.  Her coumadin was stopped after her repeat hgb on 5/17 was 8.2. Her EGD in April showed erosive H pylori gastritis, gastroesophageal stricture and an erythematous nodule (biopsy nonmalignant),  All of which could contribute to her iron deficiency anemia and heme positive stools. She was compliant (mostly) with the  H Pylori treatment but did not know whether she was supposed to be resuming iron so she has been intermittent with iron therapy.   She underwent biopsy of a 1.5 cm mass on her left mandible last week by Dr. Diona Browner [facility])  and the biopsy revealed a Merkel cell tumor. Dr. Purcell Nails is recommending urgent referral to [facility] for a Moh's procedure by Dr. Adriana Simas followed by XRT. The patient declined an office visit with Dr. Purcell Nails due to fatigue but agreed to see me today to discuss all of the above.  Prior to her visit I discussed both issues with Ree Shay, who recommended  1) confirming use of oral iron by GI 2)  oral iron bid x 7 days followed by repeat Hgb and 3) Iv iron if her anemia failed to respond.  I confirmed with Dr. Karolee Ohs, her GI specialist, the utility and appropriateness of oral iron in the setting of gastritis.    Patient is willing to go forward with all of the above recommendations.  She has no personal transportation but lives at [facility] who will be asked to provide transportation, unless her son in law can provide it when he is not working (afternoons, next Thursday only available day).    Past Medical History  Diagnosis Date  . Iron deficiency anemia   . GERD (gastroesophageal reflux disease)   . Peptic stricture of esophagus   . Hypertension   . Coronary artery disease 2004    AMI, inferior wall  . Myocardial infarction 2004  . Hyperlipidemia   . TIA (transient ischemic attack)   . Hypothyroidism   . Chronic renal insufficiency   . Diverticulosis   . Barrett's esophagus 06/2006  . Internal hemorrhoids   . Atrial fibrillation   . Osteoporosis     Past Surgical History  Procedure Date  . Cholecystectomy 1996  . Partial hysterectomy 1990    w/ bladder tacking  . Thyroidectomy, partial          The following portions of the patient's history were reviewed and updated as appropriate: Allergies, current medications, and problem list.    Review of Systems:  12 Pt  review of systems was negative except those addressed in the HPI,     History   Social History  . Marital Status: Widowed    Spouse Name: N/A    Number of Children: 2  . Years of Education: N/A   Occupational History  . Retired Dipl[employer] Services operational officer    Social History Main Topics  . Smoking status: Never Smoker   . Smokeless tobacco: Never Used  . Alcohol Use: No  . Drug Use: No  . Sexually Active: Not on file   Other Topics Concern  . Not on file   Social History Narrative  . No narrative on file    Objective:  BP 146/62  Pulse 77  Temp(Src) 97.8 F (36.6 C) (Oral)  Resp 14   Wt 121 lb 8 oz (55.112 kg)  SpO2 97%  General appearance: alert, cooperative and appears stated age.    Ears: normal TM's and external ear canals both ears Throat: lips, mucosa, and tongue normal; teeth and gums normal Neck: no adenopathy, no carotid bruit, supple, symmetrical, trachea midline and thyroid not enlarged, symmetric, no tenderness/mass/nodules Back: symmetric, no curvature. ROM normal. No CVA tenderness. Lungs: clear to auscultation bilaterally Heart: regular rate and rhythm, S1, S2 normal, no murmur, click, rub or gallop Abdomen: soft, non-tender; bowel sounds normal; no masses,  no organomegaly Pulses: 2+ and symmetric Skin: Healing skin lesion, left mandible.  Lymph nodes: Cervical, supraclavicular, and axillary nodes normal.  Assessment and Plan:  Iron deficiency anemia, unspecified Secondary to GI losses, complicated by use of coumadin and  CKD Stage IV. Discussed with patient need to take oral iron twice daily for one week and assess response of bone marrow with repeat hgb.  If no imporvement,  Dr. Neale Burly will see her for IV iron and/or transfusion.  She would meet criteria for transfusion given her history of GI blood losses and history of CAD. Recommending use of Tandem generic OTC twice daily with meals.   Coronary artery disease She has a history of an acute MI in 2004 .  No recent cardiology follow up. Not symptomatic currently.   Kidney disease, chronic, stage IV (severe, EGFR 15-29 ml/min) Followed by Dr. Lacy Duverney in [facility] for GFR < 30. Given her low iron stores, aranesp would mot likely help until her iron is replenished.   Merkel cell carcinoma of face By recent biopsy of 1.5 cm mass on left mandible.  Prognosis is poor. Dr. Purcell Nails is recommending referral to [facility] for  Moh's procedure followed by XRT .  Patient is agreeable but willl need transportation provided by [facility].    Over 60 minutes was spent in coordinating patient care and involving4  telephone discussions with Dermatology,  Gastroenterology and Hematology/Oncology  Updated Medication List Outpatient Encounter Prescriptions as of 11/22/2011  Medication Sig Dispense Refill  . amLODipine (NORVASC) 10 MG tablet Take 10 mg by mouth daily.        . benazepril (LOTENSIN) 40 MG tablet Take 40 mg by mouth daily.        . Calcium Carbonate-Vitamin D (CALTRATE 600+D PO) Take 1 tablet by mouth 2 (two) times daily.        Marland Kitchen doxazosin (CARDURA) 2 MG tablet Take 2 mg by mouth at bedtime.        . ergocalciferol (VITAMIN D2) 50000 UNITS capsule Take 50,000 Units by mouth once a week.        . furosemide (LASIX) 40 MG tablet Take 40 mg  by mouth daily.        Marland Kitchen omeprazole (PRILOSEC) 20 MG capsule Take 1 capsule (20 mg total) by mouth daily.  30 capsule  11  . rosuvastatin (CRESTOR) 10 MG tablet Take 5 mg by mouth daily.        . ferrous fumarate-iron polysaccharide complex (TANDEM) 162-115.2 MG CAPS Take 1 capsule by mouth 2 (two) times daily after a meal.  30 capsule  1  . DISCONTD: bismuth-metronidazole-tetracycline (PYLERA) 140-125-125 MG per capsule Take 3 capsules by mouth 4 (four) times daily.  120 capsule  0     Orders Placed This Encounter  Procedures  . CBC with Differential    No Follow-up on file.

## 2011-11-22 NOTE — Patient Instructions (Signed)
I want you to take an iron supplement called Tandem twice a day with a meal and return in one week for a hgb check.  If your hgb has no improved  We will send you to Dr. Neale Burly for IV iron     We will call Dr. Purcell Nails and find out how soon Dr. Glendell Docker will see you and whether the procedure will require a separate appt.

## 2011-11-22 NOTE — Telephone Encounter (Signed)
Marg is working on the appt. Patient notified and also spoke with Angelica Chessman at [facility] she is aware of everything going on.  I have scheduled patient appt at 11 today and patient will try to make the appt.  Angelica Chessman stated she will get her here any way possible.

## 2011-11-27 NOTE — Telephone Encounter (Signed)
Dr. Darrick Huntsman spoke with [facility].  Office notes have been sent.

## 2011-11-28 ENCOUNTER — Telehealth: Payer: Self-pay | Admitting: Hematology and Oncology

## 2011-11-28 ENCOUNTER — Telehealth: Payer: Self-pay | Admitting: Internal Medicine

## 2011-11-28 DIAGNOSIS — D649 Anemia, unspecified: Secondary | ICD-10-CM

## 2011-11-28 NOTE — Telephone Encounter (Signed)
S/w pt today re appt w/[facility]. Pt is familiar w/Dr. Darrick Huntsman but states she doesn't understand why she needs this appt or why Dr. Darrick Huntsman is referring her to someone else. Pt asked that I not schedule this appt and she would speak with Dr. Darrick Huntsman to see why she is sending her to [facility]. Called Dr. Melina Schools office [phone]) and s/w Erie Noe re above. Per Erie Noe she would send a message to Dr. Darrick Huntsman and agreed that their office would call me to let me know if pt will schedule appt. Erie Noe has my name/number. Chart sent back to HIM.

## 2011-11-28 NOTE — Telephone Encounter (Signed)
Karen Stuart,    Please call patient to remind her to get her stat CBC drawn first thing  Thursday morning by [facility] .  I have printed out the rx .She will need the 11:30 slot held open for her in the event I need to admit her or send her to [facility] for blood transfusion

## 2011-11-28 NOTE — Telephone Encounter (Signed)
The order has been sent to [facility].

## 2011-11-29 ENCOUNTER — Ambulatory Visit: Payer: Medicare Other | Admitting: Internal Medicine

## 2011-11-30 ENCOUNTER — Telehealth: Payer: Self-pay | Admitting: Internal Medicine

## 2011-11-30 NOTE — Telephone Encounter (Signed)
Her hemoglobin has not responded to the oral iron therapy she has been taking. This is why she is so tired. So in  Order to get her hemoglobin higher she will need intravenous iron, which is why I referred her to the [facility]: they give IV iron. They may also decide to give her a unit of blood but she has to accept the appt first.

## 2011-12-02 ENCOUNTER — Telehealth: Payer: Self-pay | Admitting: Internal Medicine

## 2011-12-02 NOTE — Telephone Encounter (Signed)
See note from 5/31,  thanks

## 2011-12-03 NOTE — Telephone Encounter (Signed)
I have tried calling patient but the line is busy.  I will keep trying.

## 2011-12-05 ENCOUNTER — Other Ambulatory Visit: Payer: Self-pay | Admitting: *Deleted

## 2011-12-05 NOTE — Telephone Encounter (Signed)
Patient notified. She is okay with the appt. She is already aware of time and place.

## 2011-12-05 NOTE — Telephone Encounter (Signed)
Opened in error

## 2011-12-19 NOTE — Telephone Encounter (Signed)
Opened in error

## 2011-12-21 ENCOUNTER — Other Ambulatory Visit (INDEPENDENT_AMBULATORY_CARE_PROVIDER_SITE_OTHER): Payer: Medicare Other | Admitting: *Deleted

## 2011-12-21 ENCOUNTER — Other Ambulatory Visit: Payer: Self-pay | Admitting: *Deleted

## 2011-12-21 ENCOUNTER — Ambulatory Visit (INDEPENDENT_AMBULATORY_CARE_PROVIDER_SITE_OTHER): Payer: Medicare Other | Admitting: Internal Medicine

## 2011-12-21 DIAGNOSIS — N390 Urinary tract infection, site not specified: Secondary | ICD-10-CM

## 2011-12-21 LAB — POCT URINALYSIS DIPSTICK
Bilirubin, UA: NEGATIVE
Blood, UA: NEGATIVE
Ketones, UA: NEGATIVE
Spec Grav, UA: 1.015
pH, UA: 5

## 2011-12-21 MED ORDER — CIPROFLOXACIN HCL 250 MG PO TABS: 250.0000 mg | ORAL_TABLET | Freq: Two times a day (BID) | ORAL | Status: DC

## 2011-12-21 MED ORDER — METRONIDAZOLE 500 MG PO TABS: 500.0000 mg | ORAL_TABLET | Freq: Three times a day (TID) | ORAL | Status: DC

## 2011-12-24 ENCOUNTER — Encounter: Payer: Self-pay | Admitting: Internal Medicine

## 2011-12-24 ENCOUNTER — Ambulatory Visit (INDEPENDENT_AMBULATORY_CARE_PROVIDER_SITE_OTHER): Payer: Medicare Other | Admitting: Internal Medicine

## 2011-12-24 VITALS — BP 148/68 | HR 63 | Temp 98.4°F | Ht 63.0 in | Wt 121.0 lb

## 2011-12-24 DIAGNOSIS — D649 Anemia, unspecified: Secondary | ICD-10-CM

## 2011-12-24 DIAGNOSIS — C4A30 Merkel cell carcinoma of unspecified part of face: Secondary | ICD-10-CM

## 2011-12-24 DIAGNOSIS — D509 Iron deficiency anemia, unspecified: Secondary | ICD-10-CM

## 2011-12-24 NOTE — Patient Instructions (Addendum)
Stop the antibiotics.  You do not have an infection  Please start taking your iron twice a day to build up your blood  You should add a supplement like Ensure every day to add calories and nutrition  Dr.  Adriana Simas is ordering a PET scan,  Not  A CT scan.  It will not harm your kidneys.

## 2011-12-24 NOTE — Assessment & Plan Note (Signed)
Secondary to GI losses and chronic gastritis in the setting of prior coumadin use for atrial fibrillation. Marland Kitchen Resume Tandem bid,  hgb to be repeated today

## 2011-12-24 NOTE — Progress Notes (Signed)
Patient ID: Karen Stuart, female   DOB: 1925-08-18, 78 y.o.   MRN: [id number] Patient Active Problem List  Diagnosis  . Iron deficiency anemia, unspecified  . Encounter for long-term (current) use of anticoagulants  . Kidney disease, chronic, stage IV (severe, EGFR 15-29 ml/min)  . Other and unspecified hyperlipidemia  . Hyperlipidemia LDL goal < 70  . Syncope and collapse  . Atrial fibrillation  . Diverticulosis  . Peptic stricture of esophagus  . TIA (transient ischemic attack)  . Coronary artery disease  . Merkel cell carcinoma of face    Subjective:  CC:   Chief Complaint  Patient presents with  . Abdominal Pain    HPI:   Karen Lightsey Thompsonis a 78 y.o. female who presents for evaluation of lower abdominal pain, chronic and recurrent loose stools. She came to clinic to drop off a urine last week , presuming she had a UTI but the UA was normal.  She was prescribed cipro/flagyl last week for presumed infectious colitis vs diverticultis but reports today that her diarrhea as been chronic since her GB surgery 40 yrs ago but has been worse since she was treated for H Pylori gastritis diagnosed during recent EGD.  Her abdominal pain is mild and nearly resolved.  She reports several nonbloody  stools per day which start off solid and end up as liquid by the end of the same bowel movement,  5 to 7 times daily.  She has had no fevers or nausea.  She stopped the iron that was prescribed after her last visit one month because she thought someone told her to.  She is scheduled to have a PET scan by Dr . Glendell Docker prior to resection of the Merkel cellca  Ca diagnosed recently on her jaw by Dr. Purcell Nails.    Past Medical History  Diagnosis Date  . Iron deficiency anemia   . GERD (gastroesophageal reflux disease)   . Peptic stricture of esophagus   . Hypertension   . Coronary artery disease 2004    AMI, inferior wall  . Myocardial infarction 2004  . Hyperlipidemia   . TIA (transient  ischemic attack)   . Hypothyroidism   . Chronic renal insufficiency   . Diverticulosis   . Barrett's esophagus 06/2006  . Internal hemorrhoids   . Atrial fibrillation   . Osteoporosis     Past Surgical History  Procedure Date  . Cholecystectomy 1996  . Partial hysterectomy 1990    w/ bladder tacking  . Thyroidectomy, partial          The following portions of the patient's history were reviewed and updated as appropriate: Allergies, current medications, and problem list.    Review of Systems:  The rest of  the  review of systems was negative except those addressed in the HPI     History   Social History  . Marital Status: Widowed    Spouse Name: N/A    Number of Children: 2  . Years of Education: N/A   Occupational History  . Retired [employer] operational officer    Social History Main Topics  . Smoking status: Never Smoker   . Smokeless tobacco: Never Used  . Alcohol Use: No  . Drug Use: No  . Sexually Active: Not on file   Other Topics Concern  . Not on file   Social History Narrative  . No narrative on file    Objective:  BP 148/68  Pulse 63  Temp 98.4 F (36.9 C) (Tympanic)  Ht 5\' 3"  (1.6 m)  Wt 121 lb (54.885 kg)  BMI 21.43 kg/m2  SpO2 98%  General appearance: alert, cooperative and appears stated age Ears: normal TM's and external ear canals both ears Throat: lips, mucosa, and tongue normal; teeth and gums normal Neck: no adenopathy, no carotid bruit, supple, symmetrical, trachea midline and thyroid not enlarged, symmetric, no tenderness/mass/nodules Back: symmetric, no curvature. ROM normal. No CVA tenderness. Lungs: clear to auscultation bilaterally Heart: regular rate and rhythm, S1, S2 normal, no murmur, click, rub or gallop Abdomen: soft, non-tender; bowel sounds normal; no masses,  no organomegaly Pulses: 2+ and symmetric Skin: Skin color, texture, turgor normal. No rashes or lesions Lymph nodes: Cervical, supraclavicular, and axillary nodes  normal.  Assessment and Plan:  Merkel cell carcinoma of face She as advised to proceed with PET scan as planned by Dr. Glendell Docker.   Iron deficiency anemia, unspecified Secondary to GI losses and chronic gastritis in the setting of prior coumadin use for atrial fibrillation. Marland Kitchen Resume Tandem bid,  hgb to be repeated today    Updated Medication List Outpatient Encounter Prescriptions as of 12/24/2011  Medication Sig Dispense Refill  . amLODipine (NORVASC) 10 MG tablet Take 10 mg by mouth daily.        . benazepril (LOTENSIN) 40 MG tablet Take 40 mg by mouth daily.        . Calcium Carbonate-Vitamin D (CALTRATE 600+D PO) Take 1 tablet by mouth 2 (two) times daily.        Marland Kitchen doxazosin (CARDURA) 2 MG tablet Take 2 mg by mouth at bedtime.        . ergocalciferol (VITAMIN D2) 50000 UNITS capsule Take 50,000 Units by mouth once a week.        . ferrous fumarate-iron polysaccharide complex (TANDEM) 162-115.2 MG CAPS Take 1 capsule by mouth 2 (two) times daily after a meal.  30 capsule  1  . furosemide (LASIX) 40 MG tablet Take 40 mg by mouth daily.        Marland Kitchen omeprazole (PRILOSEC) 20 MG capsule Take 1 capsule (20 mg total) by mouth daily.  30 capsule  11  . rosuvastatin (CRESTOR) 10 MG tablet Take 5 mg by mouth daily.        Marland Kitchen DISCONTD: ciprofloxacin (CIPRO) 250 MG tablet Take 1 tablet (250 mg total) by mouth 2 (two) times daily.  14 tablet  0  . DISCONTD: metroNIDAZOLE (FLAGYL) 500 MG tablet Take 1 tablet (500 mg total) by mouth 3 (three) times daily.  21 tablet  0     Orders Placed This Encounter  Procedures  . CBC with Differential    No Follow-up on file.

## 2011-12-24 NOTE — Assessment & Plan Note (Signed)
She as advised to proceed with PET scan as planned by Dr. Glendell Docker.

## 2011-12-25 LAB — CBC WITH DIFFERENTIAL/PLATELET
Basophils Absolute: 0 10*3/uL (ref 0.0–0.1)
Basophils Relative: 0.2 % (ref 0.0–3.0)
Eosinophils Absolute: 0.2 10*3/uL (ref 0.0–0.7)
HCT: 28.7 % — ABNORMAL LOW (ref 36.0–46.0)
Hemoglobin: 9.2 g/dL — ABNORMAL LOW (ref 12.0–15.0)
Lymphs Abs: 1.1 10*3/uL (ref 0.7–4.0)
MCHC: 31.9 g/dL (ref 30.0–36.0)
Neutro Abs: 3.6 10*3/uL (ref 1.4–7.7)
RDW: 17.1 % — ABNORMAL HIGH (ref 11.5–14.6)

## 2012-01-23 ENCOUNTER — Telehealth: Payer: Self-pay | Admitting: Internal Medicine

## 2012-01-23 NOTE — Telephone Encounter (Signed)
Roxanne from [facility] called and stated patient had surgery for a cancer removal.  She was to hold her coumadin until tomorrow.  She stated Genevieve Norlander will be going to patients home for 3 weeks for wound care and wanted to know if you wanted them to check her PT/INR while they were there.  Please advise.

## 2012-01-23 NOTE — Telephone Encounter (Signed)
Yes, she will need a PT/INR on Tuesday

## 2012-01-23 NOTE — Telephone Encounter (Signed)
Karen Stuart notified.  She will fax the results.

## 2012-01-28 ENCOUNTER — Telehealth: Payer: Self-pay | Admitting: Internal Medicine

## 2012-01-28 NOTE — Telephone Encounter (Signed)
Roxanne from [facility] called and stated patient has refused visits from [place] so they will not be able to check an INR tomorrow and she has been discharged.  Roxanne stated she instructed the patient to call here last week to let us know but patient has not done that.  Patient restarted her coumadin on Friday.  Do you want patient to come in the office for an INR check.  Please advise.

## 2012-01-28 NOTE — Telephone Encounter (Signed)
Yes, she will need to come by tomorrow for PT INR

## 2012-01-28 NOTE — Telephone Encounter (Signed)
She made an appt for tomorrow for the PT/INR

## 2012-01-29 ENCOUNTER — Other Ambulatory Visit (INDEPENDENT_AMBULATORY_CARE_PROVIDER_SITE_OTHER): Payer: Medicare Other | Admitting: *Deleted

## 2012-01-29 DIAGNOSIS — Z7901 Long term (current) use of anticoagulants: Secondary | ICD-10-CM

## 2012-02-07 ENCOUNTER — Other Ambulatory Visit (INDEPENDENT_AMBULATORY_CARE_PROVIDER_SITE_OTHER): Payer: Medicare Other | Admitting: *Deleted

## 2012-02-07 DIAGNOSIS — D509 Iron deficiency anemia, unspecified: Secondary | ICD-10-CM

## 2012-02-08 ENCOUNTER — Emergency Department: Payer: Self-pay | Admitting: Emergency Medicine

## 2012-02-08 ENCOUNTER — Telehealth: Payer: Self-pay | Admitting: Internal Medicine

## 2012-02-08 LAB — CBC
HCT: 26.1 % — ABNORMAL LOW (ref 35.0–47.0)
HGB: 8.4 g/dL — ABNORMAL LOW (ref 12.0–16.0)
MCH: 27.2 pg (ref 26.0–34.0)
MCHC: 32.4 g/dL (ref 32.0–36.0)
MCV: 84 fL (ref 80–100)
Platelet: 228 10*3/uL (ref 150–440)
RBC: 3.1 10*6/uL — ABNORMAL LOW (ref 3.80–5.20)
RDW: 18.6 % — ABNORMAL HIGH (ref 11.5–14.5)
WBC: 5.7 10*3/uL (ref 3.6–11.0)

## 2012-02-08 LAB — COMPREHENSIVE METABOLIC PANEL
Albumin: 3.8 g/dL (ref 3.4–5.0)
Alkaline Phosphatase: 84 U/L (ref 50–136)
Anion Gap: 8 (ref 7–16)
BUN: 28 mg/dL — ABNORMAL HIGH (ref 7–18)
Bilirubin,Total: 0.2 mg/dL (ref 0.2–1.0)
Calcium, Total: 9.4 mg/dL (ref 8.5–10.1)
Chloride: 109 mmol/L — ABNORMAL HIGH (ref 98–107)
Co2: 25 mmol/L (ref 21–32)
Creatinine: 2.02 mg/dL — ABNORMAL HIGH (ref 0.60–1.30)
EGFR (African American): 26 — ABNORMAL LOW
EGFR (Non-African Amer.): 22 — ABNORMAL LOW
Glucose: 121 mg/dL — ABNORMAL HIGH (ref 65–99)
Osmolality: 290 (ref 275–301)
Potassium: 3.6 mmol/L (ref 3.5–5.1)
SGOT(AST): 19 U/L (ref 15–37)
SGPT (ALT): 11 U/L — ABNORMAL LOW (ref 12–78)
Sodium: 142 mmol/L (ref 136–145)
Total Protein: 7.3 g/dL (ref 6.4–8.2)

## 2012-02-08 LAB — CBC WITH DIFFERENTIAL/PLATELET
Basophils Relative: 1.1 % (ref 0.0–3.0)
Eosinophils Absolute: 0.1 10*3/uL (ref 0.0–0.7)
HCT: 25.1 % — ABNORMAL LOW (ref 36.0–46.0)
Lymphs Abs: 1 10*3/uL (ref 0.7–4.0)
MCHC: 30.7 g/dL (ref 30.0–36.0)
MCV: 86.3 fl (ref 78.0–100.0)
Monocytes Absolute: 0.3 10*3/uL (ref 0.1–1.0)
Neutrophils Relative %: 72.6 % (ref 43.0–77.0)
RBC: 2.91 Mil/uL — ABNORMAL LOW (ref 3.87–5.11)

## 2012-02-08 NOTE — Telephone Encounter (Signed)
Critical Lab on patient:  Hemoglobin 7.8       Hematocrit 25.1  Dr. Darrick Huntsman advised patient go to ER for possible blood transfusion.  I tried to call patients home number and it only rang with no voicemail set up.  I called the number for patients daughter in the chart and left a detailed message on the voicemail.

## 2012-02-09 ENCOUNTER — Telehealth: Payer: Self-pay | Admitting: Family Medicine

## 2012-02-09 LAB — PROTIME-INR
INR: 0.9
Prothrombin Time: 12.5 secs (ref 11.5–14.7)

## 2012-02-09 NOTE — Telephone Encounter (Signed)
Pt's daughter did not get VM about transfusion until late this evening and pt has been in ER waiting room for last 5 hrs.  EDP calls at 3:45 am to ask if pt can go home b/c she is asymptomatic, has heme negative stool, hx of hgb of 8.2 5/17 (now 8.1 in ER).  Pt does not want to be admitted for emergent transfusion and MD wants me to review chart for indication other than heart disease.  Verbally read problem list to EDP who again states pt does not meet criteria for emergent transfusion and will be sending pt home.  Wants her to have outpt transfusion set up for next week if deemed necessary.

## 2012-02-10 ENCOUNTER — Telehealth: Payer: Self-pay | Admitting: Internal Medicine

## 2012-02-10 NOTE — Telephone Encounter (Signed)
ER events noted.  Patient was not transufsed because she had no symptoms and was heme negative  Please tell her that I do NOT want her to resume coumadin EVER and I do want her to keep taking the iron. Twice daly and the omeprazole that Dr. Russella Dar put her on.  She needs a repeat CBC in one week.

## 2012-02-12 NOTE — Telephone Encounter (Signed)
Patient notified, she stated she has an appt this Friday and wants to repeat the CBC then.

## 2012-02-15 ENCOUNTER — Ambulatory Visit (INDEPENDENT_AMBULATORY_CARE_PROVIDER_SITE_OTHER): Payer: Medicare Other | Admitting: Internal Medicine

## 2012-02-15 ENCOUNTER — Encounter: Payer: Self-pay | Admitting: Internal Medicine

## 2012-02-15 VITALS — BP 144/76 | HR 88 | Temp 97.8°F | Resp 16 | Wt 118.8 lb

## 2012-02-15 DIAGNOSIS — K591 Functional diarrhea: Secondary | ICD-10-CM

## 2012-02-15 DIAGNOSIS — I4891 Unspecified atrial fibrillation: Secondary | ICD-10-CM

## 2012-02-15 DIAGNOSIS — C4A30 Merkel cell carcinoma of unspecified part of face: Secondary | ICD-10-CM

## 2012-02-15 DIAGNOSIS — D509 Iron deficiency anemia, unspecified: Secondary | ICD-10-CM

## 2012-02-15 NOTE — Patient Instructions (Addendum)
Please try taking Lactase with any meal that has dairy in it  Use immodium up to 4 times daily   Liquid may work faster . Marland Kitchen  Try taking it before yor meal to prevent diarrhea.  I recommend that you use a daily pill box and fill it up once a week with all of your medications, including the iron (twice daily)   Please return the stool culture tubes so we ca rule out infection as a cause   Eat smaller meals more frequently if it is better on your stomach   Return in one week for a repeat test on your anemia   NO COUMADIN EVER AGAIN !!!!!!

## 2012-02-15 NOTE — Progress Notes (Signed)
Patient ID: Karen Stuart, female   DOB: March 17, 1926, 78 y.o.   MRN: [id number]  Patient Active Problem List  Diagnosis  . Iron deficiency anemia, unspecified  . Encounter for long-term (current) use of anticoagulants  . Kidney disease, chronic, stage IV (severe, EGFR 15-29 ml/min)  . Other and unspecified hyperlipidemia  . Hyperlipidemia LDL goal < 70  . Syncope and collapse  . Atrial fibrillation  . Diverticulosis  . Peptic stricture of esophagus  . TIA (transient ischemic attack)  . Coronary artery disease  . Merkel cell carcinoma of face  . Diarrhea, functional    Subjective:  CC:   Chief Complaint  Patient presents with  . Follow-up    3 month    HPI:   Karen Fickling Thompsonis a 78 y.o. female who presents for follow up on anemia and diarrhea.  She had occurred critically low hemoglobin last week at 7.8 and was referred to the ER for consideration of blood transfusion. This occurred in the setting of recent restarting of her Coumadin which was advised by Dr. Adriana Simas post ooperatively from resection of the Merkel cell tumor from  her left cheek. I stopped her Coumadin and ordered a CBC at which time it was very low and she was referred to the ER. She was not transfused in the ER because she did not meet criteria by current guidelines for transfusion. She has permamently suspended her Coumadin. She has not seen any blood in her stools. She's had a difficult time remembering to take her iron. Diarrhea has been chronic her entire adult life since her gallbladder operation.  She has had prior and recent GI evaluation with no definitive resolution or improvement.  Refused to finish the treatment for H PYlori because she did not tolerate the antibiotics due to nausea. Persistent diarrhea several times daily . She starts the day with a soft stool, in the morning, then has progressively looser stools as the day progresses.  The diarrhea, although frequent, is not daily . Some days has no stool.  .  Avoids eating if going out because of the loose stools,. Sometimes accompanied by cramping.     Past Medical History  Diagnosis Date  . Iron deficiency anemia   . GERD (gastroesophageal reflux disease)   . Peptic stricture of esophagus   . Hypertension   . Coronary artery disease 2004    AMI, inferior wall  . Myocardial infarction 2004  . Hyperlipidemia   . TIA (transient ischemic attack)   . Hypothyroidism   . Chronic renal insufficiency   . Diverticulosis   . Barrett's esophagus 06/2006  . Internal hemorrhoids   . Atrial fibrillation   . Osteoporosis     Past Surgical History  Procedure Date  . Cholecystectomy 1996  . Partial hysterectomy 1990    w/ bladder tacking  . Thyroidectomy, partial          The following portions of the patient's history were reviewed and updated as appropriate: Allergies, current medications, and problem list.    Review of Systems:   A comprehensive ROS was done and positive for diarrhea and fatigue.   The rest was negative.     History   Social History  . Marital Status: Widowed    Spouse Name: N/A    Number of Children: 2  . Years of Education: N/A   Occupational History  . Retired [employer] operational officer    Social History Main Topics  . Smoking status: Never Smoker   .  Smokeless tobacco: Never Used  . Alcohol Use: No  . Drug Use: No  . Sexually Active: Not on file   Other Topics Concern  . Not on file   Social History Narrative  . No narrative on file    Objective:  BP 144/76  Pulse 88  Temp 97.8 F (36.6 C) (Oral)  Resp 16  Wt 118 lb 12 oz (53.865 kg)  SpO2 93%  General appearance: alert, cooperative and appears stated age Ears: normal TM's and external ear canals both ears Throat: lips, mucosa, and tongue normal; teeth and gums normal Neck: no adenopathy, no carotid bruit, supple, symmetrical, trachea midline and thyroid not enlarged, symmetric, no tenderness/mass/nodules Back: symmetric, no curvature. ROM  normal. No CVA tenderness. Lungs: clear to auscultation bilaterally Heart: regular rate and rhythm, S1, S2 normal, no murmur, click, rub or gallop Abdomen: soft, non-tender; bowel sounds normal; no masses,  no organomegaly Pulses: 2+ and symmetric Skin: Skin color, texture, turgor normal. No rashes or lesions Lymph nodes: Cervical, supraclavicular, and axillary nodes normal.  Assessment and Plan:  Atrial fibrillation Rate control. Long-term anticoagulation with Coumadin has been stopped because of her severe anemia and heme positive stools.   Diarrhea, functional We discussed a trial of lactase for her possibility of lactose intolerance. She does not think this is been tried in the past. She does also not recall her having had stool studies for ova and parasites and C. difficile. These were sent home with her today.  Merkel cell carcinoma of face This is a very rare and very aggressive tumor. According to Diona Browner the  mortality at 5 years is very high for this type of tumor.  She has undergone resection by Dr. Adriana Simas and the surgical site does not appear infected or inflamed. She has been referred to the oncology Center for the diagnosis of Merkel cell tumor.  Iron deficiency anemia, unspecified Complicated by chronic kidney disease, heme positive stools while on Coumadin and noncompliance with iron supplements. I have emphasized her today the need to be compliant with iron supplementation twice daily. She is forgetting to take it. I have recommended that she use a pillbox. I have offered to send a home health nurse out to her home she refuses. She does not meet criteria for transfusion. Erythropoietin level will be checked once her iron stores are improved and she will be candidate for Aranesp at that time if there is no improvement. She will return in one week for repeat studies   Updated Medication List Outpatient Encounter Prescriptions as of 02/15/2012  Medication Sig Dispense Refill   . amLODipine (NORVASC) 10 MG tablet Take 10 mg by mouth daily.        . benazepril (LOTENSIN) 40 MG tablet Take 40 mg by mouth daily.        . Calcium Carbonate-Vitamin D (CALTRATE 600+D PO) Take 1 tablet by mouth 2 (two) times daily.        Marland Kitchen doxazosin (CARDURA) 2 MG tablet Take 2 mg by mouth at bedtime.        . ergocalciferol (VITAMIN D2) 50000 UNITS capsule Take 50,000 Units by mouth once a week.        . ferrous fumarate-iron polysaccharide complex (TANDEM) 162-115.2 MG CAPS Take 1 capsule by mouth 2 (two) times daily after a meal.  30 capsule  1  . furosemide (LASIX) 40 MG tablet Take 40 mg by mouth daily.        Marland Kitchen omeprazole (PRILOSEC) 20  MG capsule Take 1 capsule (20 mg total) by mouth daily.  30 capsule  11  . DISCONTD: rosuvastatin (CRESTOR) 10 MG tablet Take 5 mg by mouth daily.           Orders Placed This Encounter  Procedures  . CBC with Differential  . Iron and TIBC  . Ferritin  . Comprehensive metabolic panel    No Follow-up on file.

## 2012-02-17 NOTE — Assessment & Plan Note (Signed)
We discussed a trial of lactase for her possibility of lactose intolerance. She does not think this is been tried in the past. She does also not recall her having had stool studies for ova and parasites and C. difficile. These were sent home with her today.

## 2012-02-17 NOTE — Assessment & Plan Note (Signed)
Rate control. Long-term anticoagulation with Coumadin has been stopped because of her severe anemia and heme positive stools.

## 2012-02-17 NOTE — Assessment & Plan Note (Signed)
Complicated by chronic kidney disease, heme positive stools while on Coumadin and noncompliance with iron supplements. I have emphasized her today the need to be compliant with iron supplementation twice daily. She is forgetting to take it. I have recommended that she use a pillbox. I have offered to send a home health nurse out to her home she refuses. She does not meet criteria for transfusion. Erythropoietin level will be checked once her iron stores are improved and she will be candidate for Aranesp at that time if there is no improvement. She will return in one week for repeat studies

## 2012-02-17 NOTE — Assessment & Plan Note (Signed)
This is a very rare and very aggressive tumor. According to Karen Stuart the  mortality at 5 years is very high for this type of tumor.  She has undergone resection by Dr. Adriana Simas and the surgical site does not appear infected or inflamed. She has been referred to the oncology Center for the diagnosis of Merkel cell tumor.

## 2012-02-21 ENCOUNTER — Other Ambulatory Visit: Payer: Medicare Other

## 2012-02-22 ENCOUNTER — Other Ambulatory Visit: Payer: Medicare Other

## 2012-07-25 ENCOUNTER — Ambulatory Visit: Payer: Self-pay | Admitting: Radiation Oncology

## 2012-07-25 ENCOUNTER — Telehealth: Payer: Self-pay | Admitting: Internal Medicine

## 2012-07-25 MED ORDER — CIPROFLOXACIN HCL 250 MG PO TABS: 250.0000 mg | ORAL_TABLET | Freq: Two times a day (BID) | ORAL | Status: AC

## 2012-07-25 NOTE — Telephone Encounter (Signed)
Completed.

## 2012-07-25 NOTE — Telephone Encounter (Signed)
Morrie Sheldon calling from [facility].  States pt is having urinary burning/frequency.  Pt cannot come in due to cancer and states it will "wear her out".  Wanting Dr. Darrick Huntsman to call in a Rx to treat UTI.  Please call Morrie Sheldon [phone]

## 2012-07-25 NOTE — Telephone Encounter (Signed)
rx for cipro sent to [facility]. 1 tablet twice daily for 5 days .  Give verbal order to collect urine for culture if symptoms do no resolve in 48 hours

## 2012-07-31 ENCOUNTER — Ambulatory Visit: Payer: Self-pay | Admitting: Internal Medicine

## 2012-08-02 ENCOUNTER — Ambulatory Visit: Payer: Self-pay | Admitting: Internal Medicine

## 2012-08-06 ENCOUNTER — Ambulatory Visit: Payer: Self-pay | Admitting: Radiation Oncology

## 2012-08-30 ENCOUNTER — Ambulatory Visit: Payer: Self-pay | Admitting: Internal Medicine

## 2012-09-08 LAB — CBC CANCER CENTER
Basophil #: 0.1 x10 3/mm (ref 0.0–0.1)
Eosinophil #: 0.1 x10 3/mm (ref 0.0–0.7)
HCT: 23.7 % — ABNORMAL LOW (ref 35.0–47.0)
HGB: 7.3 g/dL — ABNORMAL LOW (ref 12.0–16.0)
Lymphocyte #: 1.3 x10 3/mm (ref 1.0–3.6)
Lymphocyte %: 20.2 %
MCH: 24.4 pg — ABNORMAL LOW (ref 26.0–34.0)
MCHC: 30.7 g/dL — ABNORMAL LOW (ref 32.0–36.0)
MCV: 79 fL — ABNORMAL LOW (ref 80–100)
Monocyte #: 0.5 x10 3/mm (ref 0.2–0.9)
Monocyte %: 7.9 %
RBC: 2.98 10*6/uL — ABNORMAL LOW (ref 3.80–5.20)
RDW: 17.6 % — ABNORMAL HIGH (ref 11.5–14.5)
WBC: 6.4 x10 3/mm (ref 3.6–11.0)

## 2012-09-08 LAB — COMPREHENSIVE METABOLIC PANEL
Anion Gap: 12 (ref 7–16)
BUN: 25 mg/dL — ABNORMAL HIGH (ref 7–18)
Bilirubin,Total: 0.2 mg/dL (ref 0.2–1.0)
Calcium, Total: 9.1 mg/dL (ref 8.5–10.1)
Chloride: 103 mmol/L (ref 98–107)
Co2: 25 mmol/L (ref 21–32)
Creatinine: 1.98 mg/dL — ABNORMAL HIGH (ref 0.60–1.30)
SGOT(AST): 14 U/L — ABNORMAL LOW (ref 15–37)
SGPT (ALT): 14 U/L (ref 12–78)
Sodium: 140 mmol/L (ref 136–145)
Total Protein: 7 g/dL (ref 6.4–8.2)

## 2012-09-08 LAB — RETICULOCYTES: Absolute Retic Count: 0.0374 10*6/uL

## 2012-09-08 LAB — FERRITIN: Ferritin (ARMC): 7 ng/mL — ABNORMAL LOW (ref 8–388)

## 2012-09-25 LAB — CBC CANCER CENTER
Basophil #: 0.1 x10 3/mm (ref 0.0–0.1)
Basophil %: 1.3 %
Eosinophil #: 0.1 x10 3/mm (ref 0.0–0.7)
Eosinophil %: 1.5 %
HCT: 27.3 % — ABNORMAL LOW (ref 35.0–47.0)
Lymphocyte #: 0.9 x10 3/mm — ABNORMAL LOW (ref 1.0–3.6)
MCH: 25.3 pg — ABNORMAL LOW (ref 26.0–34.0)
MCHC: 31 g/dL — ABNORMAL LOW (ref 32.0–36.0)
Monocyte #: 0.3 x10 3/mm (ref 0.2–0.9)
Monocyte %: 6.4 %
RBC: 3.34 10*6/uL — ABNORMAL LOW (ref 3.80–5.20)
WBC: 5.4 x10 3/mm (ref 3.6–11.0)

## 2012-09-30 ENCOUNTER — Ambulatory Visit: Payer: Self-pay | Admitting: Internal Medicine

## 2012-10-03 ENCOUNTER — Telehealth: Payer: Self-pay | Admitting: General Practice

## 2012-10-03 NOTE — Telephone Encounter (Signed)
Hospice called just to inform you that Pt had been admitted into their care either by Dr. Samuella Cota or will be seeing Dr. Samuella Cota. Could not make out that protion of her message.

## 2012-10-21 DIAGNOSIS — I4891 Unspecified atrial fibrillation: Secondary | ICD-10-CM

## 2012-10-21 DIAGNOSIS — I10 Essential (primary) hypertension: Secondary | ICD-10-CM

## 2012-10-21 DIAGNOSIS — N183 Chronic kidney disease, stage 3 (moderate): Secondary | ICD-10-CM

## 2012-10-21 DIAGNOSIS — C4A30 Merkel cell carcinoma of unspecified part of face: Secondary | ICD-10-CM

## 2012-11-18 DIAGNOSIS — I4891 Unspecified atrial fibrillation: Secondary | ICD-10-CM

## 2012-11-18 DIAGNOSIS — I10 Essential (primary) hypertension: Secondary | ICD-10-CM

## 2012-11-18 DIAGNOSIS — N183 Chronic kidney disease, stage 3 (moderate): Secondary | ICD-10-CM

## 2012-11-18 DIAGNOSIS — C4A9 Merkel cell carcinoma, unspecified: Secondary | ICD-10-CM

## 2012-12-01 DIAGNOSIS — S60219A Contusion of unspecified wrist, initial encounter: Secondary | ICD-10-CM

## 2012-12-04 DIAGNOSIS — S72033A Displaced midcervical fracture of unspecified femur, initial encounter for closed fracture: Secondary | ICD-10-CM

## 2012-12-30 ENCOUNTER — Ambulatory Visit: Payer: Medicare Other | Admitting: Internal Medicine

## 2013-01-12 DIAGNOSIS — C4A4 Merkel cell carcinoma of scalp and neck: Secondary | ICD-10-CM

## 2013-01-12 DIAGNOSIS — C779 Secondary and unspecified malignant neoplasm of lymph node, unspecified: Secondary | ICD-10-CM

## 2013-01-27 DIAGNOSIS — I4891 Unspecified atrial fibrillation: Secondary | ICD-10-CM

## 2013-01-27 DIAGNOSIS — C4A9 Merkel cell carcinoma, unspecified: Secondary | ICD-10-CM

## 2013-01-27 DIAGNOSIS — F411 Generalized anxiety disorder: Secondary | ICD-10-CM

## 2013-01-27 DIAGNOSIS — I10 Essential (primary) hypertension: Secondary | ICD-10-CM

## 2013-02-26 DIAGNOSIS — I10 Essential (primary) hypertension: Secondary | ICD-10-CM

## 2013-02-26 DIAGNOSIS — I4891 Unspecified atrial fibrillation: Secondary | ICD-10-CM

## 2013-02-26 DIAGNOSIS — C4A9 Merkel cell carcinoma, unspecified: Secondary | ICD-10-CM

## 2013-02-26 DIAGNOSIS — F411 Generalized anxiety disorder: Secondary | ICD-10-CM

## 2013-04-15 DIAGNOSIS — C4A4 Merkel cell carcinoma of scalp and neck: Secondary | ICD-10-CM

## 2013-04-15 DIAGNOSIS — C779 Secondary and unspecified malignant neoplasm of lymph node, unspecified: Secondary | ICD-10-CM

## 2013-04-22 DIAGNOSIS — I10 Essential (primary) hypertension: Secondary | ICD-10-CM

## 2013-04-22 DIAGNOSIS — I4891 Unspecified atrial fibrillation: Secondary | ICD-10-CM

## 2013-04-22 DIAGNOSIS — C4A9 Merkel cell carcinoma, unspecified: Secondary | ICD-10-CM

## 2013-04-22 DIAGNOSIS — F411 Generalized anxiety disorder: Secondary | ICD-10-CM

## 2013-06-04 DIAGNOSIS — I4891 Unspecified atrial fibrillation: Secondary | ICD-10-CM

## 2013-06-04 DIAGNOSIS — I10 Essential (primary) hypertension: Secondary | ICD-10-CM

## 2013-06-04 DIAGNOSIS — L89309 Pressure ulcer of unspecified buttock, unspecified stage: Secondary | ICD-10-CM

## 2013-06-04 DIAGNOSIS — C4A9 Merkel cell carcinoma, unspecified: Secondary | ICD-10-CM

## 2013-06-12 DIAGNOSIS — C4A4 Merkel cell carcinoma of scalp and neck: Secondary | ICD-10-CM

## 2013-06-12 DIAGNOSIS — C779 Secondary and unspecified malignant neoplasm of lymph node, unspecified: Secondary | ICD-10-CM

## 2013-07-30 IMAGING — CR DG CHEST 1V PORT
1 series · 1 of 1 positions shown · non-contrast
Comparison: none

REASON FOR EXAM: weakness
COMMENTS:

PROCEDURE:     DXR - DXR PORTABLE CHEST SINGLE VIEW  - November 04, 2011  [DATE]
RESULT:     The lungs are mildly hyperinflated. The interstitial markings
are increased especially inferiorly. The cardiac silhouette is enlarged. The
pulmonary vascularity is not engorged.

[portable]
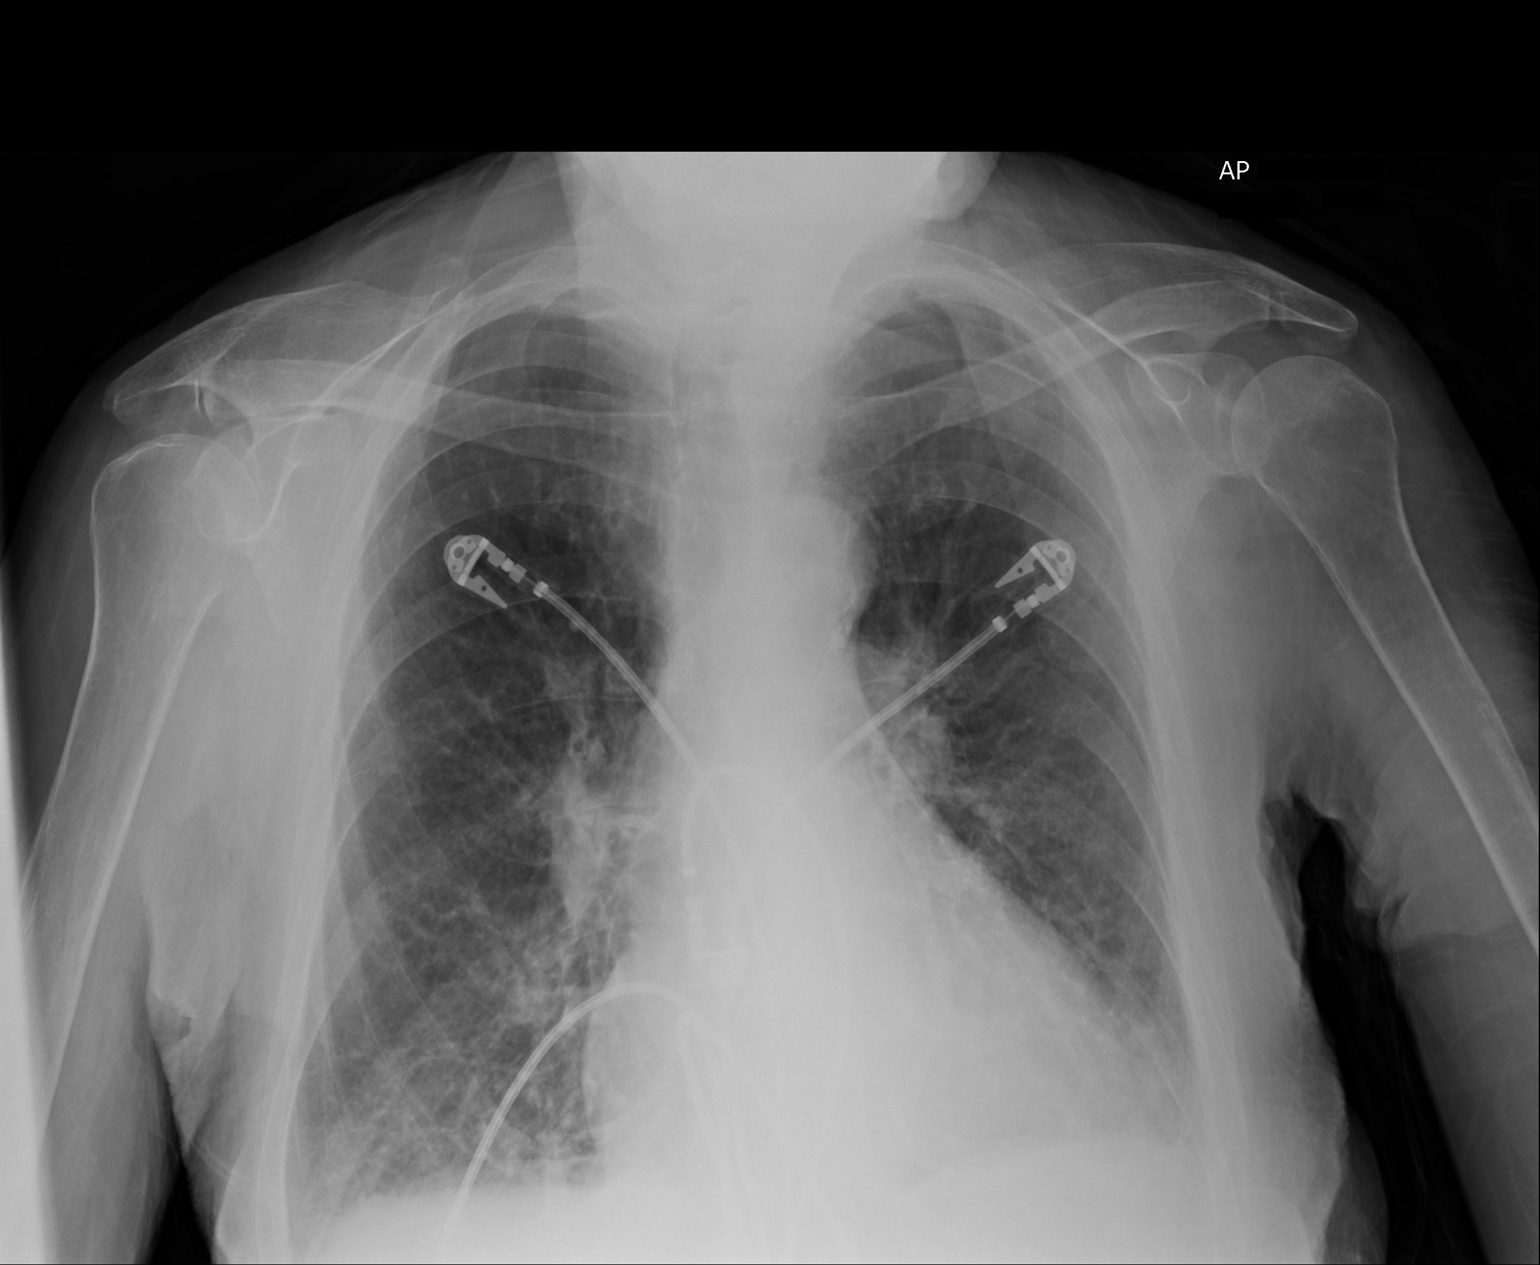

[1 of 1 positions shown; findings below may reference images not displayed]

IMPRESSION: The findings are consistent with COPD. I cannot exclude
subsegmental atelectasis at the lung bases though the findings could be
chronic. A followup PA and lateral chest x-ray would be of value.

[REDACTED]

## 2013-08-02 DEATH — deceased

## 2013-09-07 ENCOUNTER — Telehealth: Payer: Self-pay

## 2013-09-07 NOTE — Telephone Encounter (Signed)
Patient past away @ [facility] per Iver Nestle in [place]

## 2014-10-22 NOTE — Consult Note (Signed)
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   HPI   patient is a 81 year old female originally presented with a skin lesion of the left neck back in July of 2013. Initial biopsy was positive for "Merkel cell carcinoma of skin". Tumor was 0.9 x 0.5 cm with a thickness of 0.4 cm. She underwent a wide excision and limited lymph node dissection. Tumor was infiltrating and positive for CK 20 and synaptophysin. Initial workup including PET CT scan showed avid uptake up to 6 SUV units in the mediastinum with a left hilar hypermetabolic area. No workup of her PET positivity in the lung was undertaken. She went on within 5 months to have a large recurrence in the left neck and parotid region and underwent complete well 4 and 5 lymph node dissection as well as left paroidectomy showing again Merkel cell carcinoma 4 cm involvement of the left parotid with anterior and posterior margins focally positive for tumor. There was also metastatic carcinoma involving 2 of 6 lymph nodes largest measuring 4.4 cm with extracapsular extension. She was seen by radiation oncology at [facility] and postoperative radiation therapy was recommended. She does have in complete closure of the left lid. She is now referred to radiation collagen closer to home for evaluation of treatment. She's also lost significant weight about 20 pounds she feels weak and fatigued and overall poor general condition.  Past Hx:    Merkel Cell Cancer:    Hypertension:    Transient Ischemic Attack:    Myocardial Infarct:    Hypothyroidism:    Cholecystectomy:    Hysterectomy - Partial:    Thyroidectomy:   Past, Family and Social History:   Past  Medical History positive    Cardiovascular atrial fibrillation; hypertension; myocardial infarction    Endocrine hypothyroidism    Neurological/Psychiatric TIA    Past Surgical History cholecystectomy; thyroidectomy    Family History noncontributory    Social History noncontributory    Additional Past Medical and Surgical History accompanied by her daughter and son-in-law today in   Allergies:   Codeine: N/V/Diarrhea  Home Meds:  Home Medications: Medication Instructions Status  Vitamin D2 50000  orally once a week Active  doxazosin 2 mg oral tablet tab(s) orally once a day (at bedtime) Active  omeprazole 20 mg oral delayed release tablet 1 tab(s) orally once a day Active  Tandem 162 mg-115.2 mg oral capsule 1 cap(s) orally once a day with breakfast. Active  furosemide 40 mg oral tablet 1  orally once a day  Active  amlodipine 10 mg oral tablet 1  orally once a day  Active  benazepril 40 mg oral tablet 1  orally once a day  Active  oxycodone 5 mg oral tablet tab(s) orally 1 or 2 tablets every four hours as needed for pain.  Active  Akwa Tears 1.4%   1 drop into each eye every two hours.  Active  Senokot S 50 mg-8.6 mg oral tablet 1 tab(s) orally 2 times a day Active  bacitracin zinc 500 units/g topical ointment Apply topically to affected area 3 times a day Active   Review of Systems:   General negative    Performance Status (ECOG) 0  Skin see HPI    Breast negative    Ophthalmologic see HPI    ENMT negative    Respiratory and Thorax negative    Cardiovascular see HPI    Gastrointestinal negative    Genitourinary negative    Musculoskeletal negative    Neurological negative    Psychiatric negative    Hematology/Lymphatics negative    Endocrine see HPI    Allergic/Immunologic negative    Review of Systems   except for closure of the left lid. Weakness weight loss and dizziness according to nurse's notes Patient denies any weight loss, fatigue,  weakness, fever, chills or night sweats. Patient denies any loss of vision, blurred vision. Patient denies any ringing  of the ears or hearing loss. No irregular heartbeat. Patient denies heart murmur or history of fainting. Patient denies any chest pain or pain radiating to her upper extremities. Patient denies any shortness of breath, difficulty breathing at night, cough or hemoptysis. Patient denies any swelling in the lower legs. Patient denies any nausea vomiting, vomiting of blood, or coffee ground material in the vomitus. Patient denies any stomach pain. Patient states has had normal bowel movements no significant constipation or diarrhea. Patient denies any dysuria, hematuria or significant nocturia. Patient denies any problems walking, swelling in the joints or loss of balance. Patient denies any skin changes, loss of hair or loss of weight. Patient denies any excessive worrying or anxiety or significant depression. Patient denies any problems with insomnia. Patient denies excessive thirst, polyuria, polydipsia. Patient denies any swollen glands, patient denies easy bruising or easy bleeding. Patient denies any recent infections, allergies or URI. Patient "s visual fields have not changed significantly in recent time.  Nursing Notes:  Nursing Vital Signs and Chemo Nursing Nursing Notes: *CC Vital Signs Flowsheet:   30-Jan-14 14:28   Temp Temperature 98.6   Pulse Pulse 64   Respirations Respirations 20   SBP SBP 176   DBP DBP 66   Pain Scale (0-10)  10 left shoulder   Current Weight (kg) (kg) 52.3   Height (cm) centimeters 155   BSA (m2) 1.4   Physical Exam:  General/Skin/HEENT:   Additional PE ell-developed elderly female in NAD. She status post left paroidectomy and left lymph node dissection of the cervical chain. Incision is well-healed. Oral cavity is clear no oral mucosal lesions are identified. Indirect mirror examination shows upper airway clear vallecula and base of tongue within  normal limits. There is some damage the facial nerve with inability to close to fully the left lid. No evidence of mass or nodularity in the left parotid bed, cervical or supraclavicular region is noted. Lungs are clear to A&P cardiac examination shows regular rate and rhythm. Abdomen is benign.   Breasts/Resp/CV/GI/GU:   Respiratory and Thorax normal    Gastrointestinal normal    Genitourinary normal   MS/Neuro/Psych/Lymph:   Musculoskeletal normal    Lymphatics normal   Assessment and Plan:  Impression:   patient is status post 2 head and neck resections for small cell undifferentiated carcinoma presumed to be Merkel cell carcinoma Workup is needed of PET positive left hilar and mediastinal nodal regions.  Plan:   I discussed the case personally with medical oncology. We have reviewed the patient's PET/CT scan provided by [facility]. There does seem to be significant abnormality in the mediastinum left hilar region with avid PET positivity. I cannot be sure that this is non-originally a small cell lung cancer which had metastasized the head and neck  region. I have ordered a PET/CT scan for followup since one has not been performed over 6 months and me to delineate exactly areas of residual disease. Also believe bronchoscopy may be in order to try to biopsy by endoscopic ultrasound the left hilar region or mediastinal nodes. Have discussed the case with Dr. Ma Hillock who agrees with my recommendations and will be seeing the patient and consultation early next week after her PET/CT scan. Certainly if this is small cell lung cancer systemic chemotherapy would be in order. Patient and family are aware of my recommendations. Plans were made with Dr. Ma Hillock for PET/CT scan. I've also requested her pathology slides from [facility] for comparison.  I would like to take this opportunity to thank you for allowing me to continue to participate in this patient's care.  Electronic Signatures: Armstead Peaks (MD)   (Signed 31-Jan-14 10:39)  Authored: HPI, Diagnosis, Past Hx, PFSH, Allergies, Home Meds, ROS, Nursing Notes, Physical Exam, Encounter Assessment and Plan   Last Updated: 31-Jan-14 10:39 by Armstead Peaks (MD)
